# Patient Record
Sex: Male | Born: 1960 | Race: White | Hispanic: No | Marital: Married | Smoking: Never smoker
Health system: Southern US, Community
[De-identification: ages and names within clinical notes are randomized; demographics above are authoritative.]

## PROBLEM LIST (undated history)

## (undated) DIAGNOSIS — T7840XA Allergy, unspecified, initial encounter: Secondary | ICD-10-CM

## (undated) DIAGNOSIS — K429 Umbilical hernia without obstruction or gangrene: Secondary | ICD-10-CM

## (undated) DIAGNOSIS — Z87898 Personal history of other specified conditions: Secondary | ICD-10-CM

## (undated) DIAGNOSIS — D689 Coagulation defect, unspecified: Secondary | ICD-10-CM

## (undated) DIAGNOSIS — H538 Other visual disturbances: Secondary | ICD-10-CM

## (undated) DIAGNOSIS — J309 Allergic rhinitis, unspecified: Secondary | ICD-10-CM

## (undated) DIAGNOSIS — R079 Chest pain, unspecified: Secondary | ICD-10-CM

## (undated) HISTORY — DX: Umbilical hernia without obstruction or gangrene: K42.9

## (undated) HISTORY — DX: Personal history of other specified conditions: Z87.898

## (undated) HISTORY — DX: Coagulation defect, unspecified: D68.9

## (undated) HISTORY — DX: Allergic rhinitis, unspecified: J30.9

## (undated) HISTORY — DX: Chest pain, unspecified: R07.9

## (undated) HISTORY — PX: HERNIA REPAIR: SHX51

## (undated) HISTORY — DX: Allergy, unspecified, initial encounter: T78.40XA

---
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
## 2009-05-05 ENCOUNTER — Ambulatory Visit: Payer: Self-pay | Admitting: Internal Medicine

## 2009-05-05 DIAGNOSIS — J309 Allergic rhinitis, unspecified: Secondary | ICD-10-CM

## 2009-05-05 DIAGNOSIS — K429 Umbilical hernia without obstruction or gangrene: Secondary | ICD-10-CM | POA: Insufficient documentation

## 2009-05-05 DIAGNOSIS — G43109 Migraine with aura, not intractable, without status migrainosus: Secondary | ICD-10-CM

## 2009-05-05 DIAGNOSIS — Z87898 Personal history of other specified conditions: Secondary | ICD-10-CM

## 2009-05-05 HISTORY — DX: Umbilical hernia without obstruction or gangrene: K42.9

## 2009-05-05 HISTORY — DX: Personal history of other specified conditions: Z87.898

## 2009-05-05 HISTORY — DX: Allergic rhinitis, unspecified: J30.9

## 2010-10-12 ENCOUNTER — Ambulatory Visit: Payer: Self-pay | Admitting: Internal Medicine

## 2010-10-12 ENCOUNTER — Encounter: Payer: Self-pay | Admitting: Internal Medicine

## 2010-10-12 DIAGNOSIS — R079 Chest pain, unspecified: Secondary | ICD-10-CM

## 2010-10-12 HISTORY — DX: Chest pain, unspecified: R07.9

## 2011-01-04 NOTE — Assessment & Plan Note (Signed)
Summary: chest pain/dm   Vital Signs:  Patient profile:   50 year old male Weight:      181 pounds Temp:     98.3 degrees F oral BP sitting:   116 / 70  (left arm) Cuff size:   regular  Vitals Entered By: Duard Brady LPN (October 12, 2010 2:18 PM) CC: c/o chest heaviness x 3 days , sob no n/v or heartburn Is Patient Diabetic? No   CC:  c/o chest heaviness x 3 days  and sob no n/v or heartburn.  History of Present Illness: 50 year old patient without significant past medical history except for allergic rhinitis.  For the past 3 to 4 days and perhaps longer, he has had some vague chest heaviness.  It is not related to food intake or activity.  He does wax and wane a bit, but seems to be almost a constant discomfort.  There are no associated symptoms.  He states and he is physically active, such as walking up and down stairs quickly.  He gets a bit more short of breath, but no worsening chest pain.  He has no cardiac risk factors.  He denies any wheezing.  His father has a history of coronary artery disease, as well as valvular heart disease in his mother has cardiac dysrhythmia.  Both are still living.  He has a number of siblings without history of coronary artery disease.  Preventive Screening-Counseling & Management  Alcohol-Tobacco     Smoking Status: never  Allergies: 1)  ! Erythrocin Stearate (Erythromycin Stearate)  Past History:  Past Medical History: Reviewed history from 05/05/2009 and no changes required. ocular migraines Allergic rhinitis  Past Surgical History: Reviewed history from 05/05/2009 and no changes required. bilateral inguinal hernia at birth, 47  Family History: Reviewed history from 05/05/2009 and no changes required. father age 29, history of recent MI, and hypercholesterolemia mother, age 2, cardiac dysrhythmia, otherwise, in good health; leukemia  Five brothers 3 sisters-one sister with thyroid disorder paternal aunt with breast  cancer  Social History: Reviewed history from 05/05/2009 and no changes required. 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  The patient denies anorexia, fever, weight loss, weight gain, vision loss, decreased hearing, hoarseness, syncope, dyspnea on exertion, peripheral edema, prolonged cough, headaches, hemoptysis, abdominal pain, melena, hematochezia, severe indigestion/heartburn, hematuria, incontinence, genital sores, muscle weakness, suspicious skin lesions, transient blindness, difficulty walking, depression, unusual weight change, abnormal bleeding, enlarged lymph nodes, angioedema, breast masses, and testicular masses.    Physical Exam  General:  Well-developed,well-nourished,in no acute distress; alert,appropriate and cooperative throughout examination Head:  Normocephalic and atraumatic without obvious abnormalities. No apparent alopecia or balding. Eyes:  No corneal or conjunctival inflammation noted. EOMI. Perrla. Funduscopic exam benign, without hemorrhages, exudates or papilledema. Vision grossly normal. Mouth:  Oral mucosa and oropharynx without lesions or exudates.  Teeth in good repair. Neck:  No deformities, masses, or tenderness noted. Lungs:  Normal respiratory effort, chest expands symmetrically. Lungs are clear to auscultation, no crackles or wheezes. O2 saturation 98 Heart:  Normal rate and regular rhythm. S1 and S2 normal without gallop, murmur, click, rub or other extra sounds. pulse rate 60 Abdomen:  Bowel sounds positive,abdomen soft and non-tender without masses, organomegaly or hernias noted. Msk:  No deformity or scoliosis noted of thoracic or lumbar spine.   Pulses:  R and L carotid,radial,femoral,dorsalis pedis and posterior tibial pulses are full and equal bilaterally Extremities:  No clubbing,  cyanosis, edema, or deformity noted with normal full range of motion of all joints.     Impression & Recommendations:  Problem # 1:  CHEST PAIN (ICD-786.50)  Orders: EKG w/ Interpretation (93000)  Problem # 2:  ALLERGIC RHINITIS (ICD-477.9)  Patient Instructions: 1)  Avoid foods high in acid (tomatoes, citrus juices, spicy foods). Avoid eating within two hours of lying down or before exercising. Do not over eat; try smaller more frequent meals. Elevate head of bed twelve inches when sleeping. 2)  Symbicort 2 puffs twice daily  twice daily 3)  Dexilant one daily  4)  call if no improvement or any worsening of a  Orders Added: 1)  EKG w/ Interpretation [93000] 2)  Est. Patient Level III [16109]

## 2011-03-24 ENCOUNTER — Encounter: Payer: Self-pay | Admitting: Family Medicine

## 2011-03-24 ENCOUNTER — Ambulatory Visit (INDEPENDENT_AMBULATORY_CARE_PROVIDER_SITE_OTHER): Payer: Managed Care, Other (non HMO) | Admitting: Family Medicine

## 2011-03-24 VITALS — BP 140/94 | Temp 97.8°F | Ht 73.0 in | Wt 172.0 lb

## 2011-03-24 DIAGNOSIS — R209 Unspecified disturbances of skin sensation: Secondary | ICD-10-CM

## 2011-03-24 DIAGNOSIS — R202 Paresthesia of skin: Secondary | ICD-10-CM

## 2011-03-24 NOTE — Patient Instructions (Signed)
Start aspirin one daily. Follow up immediately for any recurrent numbness or weakness or any new neurologic symptoms.

## 2011-03-24 NOTE — Progress Notes (Signed)
  Subjective:    Patient ID: Dylan Terry, male    DOB: [birth year], 50 y.o.   MRN: [id number]  HPI Patient seen with question of TIA 2 days ago. He had onset of some right facial numbness followed by right arm numbness but no weakness. Symptoms lasted about one half hours then fully resolved. No slurred speech. No ataxia. No headaches. No other focal neurologic concerns. Note prior history of similar symptoms. No recurrent symptoms yesterday or today.  He takes no medications. Nonsmoker. No history of hypertension or diabetes. Recent lipids through work with cholesterol 177. He is not taking aspirin.  He has what sounds like past history of ocular migraines. Again no recent headaches.   Review of Systems  Constitutional: Negative for chills, activity change, appetite change and fatigue.  Respiratory: Negative for cough and shortness of breath.   Cardiovascular: Negative for chest pain, palpitations and leg swelling.  Gastrointestinal: Negative for abdominal pain.  Neurological: Positive for numbness. Negative for dizziness, tremors, seizures, syncope, speech difficulty, weakness, light-headedness and headaches.       Objective:   Physical Exam  Constitutional: He is oriented to person, place, and time. He appears well-developed and well-nourished. No distress.  HENT:  Head: Normocephalic and atraumatic.  Right Ear: External ear normal.  Left Ear: External ear normal.  Mouth/Throat: No oropharyngeal exudate.  Eyes: Pupils are equal, round, and reactive to light.  Neck: No thyromegaly present.  Cardiovascular: Normal rate, regular rhythm and normal heart sounds.  Exam reveals no gallop and no friction rub.   No murmur heard. Pulmonary/Chest: Effort normal and breath sounds normal. No respiratory distress. He has no wheezes. He has no rales.  Musculoskeletal: He exhibits no edema.  Lymphadenopathy:    He has no cervical adenopathy.  Neurological: He is alert and oriented to person, place,  and time. He has normal reflexes. No cranial nerve deficit.       Cerebellar normal by finger to nose testing. Babinski's downgoing bilaterally. No focal weakness. No sensory impairment  Skin: No rash noted.  Psychiatric: He has a normal mood and affect. His behavior is normal.          Assessment & Plan:  Transient paresthesias right face and right upper extremity over 2 days ago. No recurrent symptoms since then. We discussed this could represent TIA and recommended further evaluation. We'll schedule carotid Dopplers and echocardiogram. Discussed MRI.

## 2011-03-29 ENCOUNTER — Ambulatory Visit
Admission: RE | Admit: 2011-03-29 | Discharge: 2011-03-29 | Disposition: A | Discharge status: Home / Self Care | Payer: Managed Care, Other (non HMO) | Source: Ambulatory Visit | Attending: Family Medicine | Admitting: Family Medicine

## 2011-03-29 DIAGNOSIS — R202 Paresthesia of skin: Secondary | ICD-10-CM

## 2011-03-30 ENCOUNTER — Telehealth: Payer: Self-pay | Admitting: *Deleted

## 2011-03-30 NOTE — Telephone Encounter (Signed)
Spoke with pt regarding MRI results

## 2011-03-30 NOTE — Progress Notes (Signed)
Quick Note:  Pt informed ______ 

## 2011-03-30 NOTE — Telephone Encounter (Signed)
Would like to speak to Dr. Caryl Never about MRI results.

## 2011-04-05 ENCOUNTER — Other Ambulatory Visit: Payer: Self-pay | Admitting: Internal Medicine

## 2011-04-05 ENCOUNTER — Other Ambulatory Visit (HOSPITAL_COMMUNITY): Payer: Self-pay | Admitting: Radiology

## 2011-04-05 DIAGNOSIS — R29818 Other symptoms and signs involving the nervous system: Secondary | ICD-10-CM

## 2011-04-05 DIAGNOSIS — G459 Transient cerebral ischemic attack, unspecified: Secondary | ICD-10-CM

## 2011-04-06 ENCOUNTER — Other Ambulatory Visit: Payer: Self-pay | Admitting: *Deleted

## 2011-04-06 ENCOUNTER — Ambulatory Visit (HOSPITAL_COMMUNITY): Payer: Managed Care, Other (non HMO) | Attending: Internal Medicine | Admitting: Radiology

## 2011-04-06 ENCOUNTER — Encounter (INDEPENDENT_AMBULATORY_CARE_PROVIDER_SITE_OTHER): Payer: Managed Care, Other (non HMO) | Admitting: *Deleted

## 2011-04-06 DIAGNOSIS — R29818 Other symptoms and signs involving the nervous system: Secondary | ICD-10-CM

## 2011-04-06 DIAGNOSIS — R202 Paresthesia of skin: Secondary | ICD-10-CM

## 2011-04-06 DIAGNOSIS — R209 Unspecified disturbances of skin sensation: Secondary | ICD-10-CM | POA: Insufficient documentation

## 2011-04-06 DIAGNOSIS — G459 Transient cerebral ischemic attack, unspecified: Secondary | ICD-10-CM

## 2011-04-12 ENCOUNTER — Telehealth: Payer: Self-pay | Admitting: Internal Medicine

## 2011-04-12 NOTE — Telephone Encounter (Signed)
Please call/notify patient that lab/test/procedure is normal 

## 2011-04-12 NOTE — Telephone Encounter (Signed)
Pt req test results re: ekg and carotid doppler. Pls call.

## 2011-04-12 NOTE — Telephone Encounter (Signed)
Please advise 

## 2011-04-13 NOTE — Progress Notes (Signed)
Quick Note:  Pt informed on personally identified cell VM ______ 

## 2011-04-14 NOTE — Telephone Encounter (Signed)
Please call patient and notify lab is normal

## 2011-04-14 NOTE — Telephone Encounter (Signed)
Attempt to call cell# - VM - [id number] if questions - labs WNL

## 2012-10-03 ENCOUNTER — Encounter: Payer: Self-pay | Admitting: Internal Medicine

## 2012-10-03 ENCOUNTER — Ambulatory Visit (INDEPENDENT_AMBULATORY_CARE_PROVIDER_SITE_OTHER): Payer: Managed Care, Other (non HMO) | Admitting: Internal Medicine

## 2012-10-03 VITALS — BP 125/84 | Temp 97.6°F | Wt 175.0 lb

## 2012-10-03 DIAGNOSIS — K4090 Unilateral inguinal hernia, without obstruction or gangrene, not specified as recurrent: Secondary | ICD-10-CM

## 2012-10-03 NOTE — Patient Instructions (Signed)
General surgical followup as discussed  Return in one year for an annual exam

## 2012-10-03 NOTE — Progress Notes (Signed)
  Subjective:    Patient ID: Dylan Terry, male    DOB: 25-Aug-1961, 51 y.o.   MRN: [id number]  HPI  51 year old patient who has a history of a small umbilical hernia.  He had bilateral inguinal hernia repairs at a very early age. Or the past year he has noted a bulge in the left groin area. He has intermittent pain with activities.  Bulge is always very easily reducible  Past Medical History  Diagnosis Date  . ALLERGIC RHINITIS 05/05/2009  . HERNIA, UMBILICAL 05/05/2009  . CHEST PAIN 10/12/2010  . OPHTHALMIC MIGRAINES, HX OF 05/05/2009    History   Social History  . Marital Status: Married    Spouse Name: N/A    Number of Children: N/A  . Years of Education: N/A   Occupational History  . Not on file.   Social History Main Topics  . Smoking status: Never Smoker   . Smokeless tobacco: Not on file  . Alcohol Use: Not on file  . Drug Use: Not on file  . Sexually Active: Not on file   Other Topics Concern  . Not on file   Social History Narrative  . No narrative on file    Past Surgical History  Procedure Date  . Hernia repair 1962    inguinal    Family History  Problem Relation Age of Onset  . Heart disease Mother 36    cardiac dysrythmia  . Heart disease Father     MI  . Hyperlipidemia Father   . Thyroid disease Sister     3 sisters  . Thyroid disease Brother     5 brothers  . Cancer Paternal Aunt     breast    Allergies  Allergen Reactions  . Erythromycin Stearate     Current Outpatient Prescriptions on File Prior to Visit  Medication Sig Dispense Refill  . Multiple Vitamins-Minerals (MENS MULTI VITAMIN & MINERAL PO) Take by mouth.          BP 125/84  Temp 97.6 F (36.4 C) (Oral)  Wt 175 lb (79.379 kg)       Review of Systems  Constitutional: Negative for fever, chills, appetite change and fatigue.  HENT: Negative for hearing loss, ear pain, congestion, sore throat, trouble swallowing, neck stiffness, dental problem, voice change and tinnitus.     Eyes: Negative for pain, discharge and visual disturbance.  Respiratory: Negative for cough, chest tightness, wheezing and stridor.   Cardiovascular: Negative for chest pain, palpitations and leg swelling.  Gastrointestinal: Positive for abdominal pain. Negative for nausea, vomiting, diarrhea, constipation, blood in stool and abdominal distention.  Genitourinary: Negative for urgency, hematuria, flank pain, discharge, difficulty urinating and genital sores.  Musculoskeletal: Negative for myalgias, back pain, joint swelling, arthralgias and gait problem.  Skin: Negative for rash.  Neurological: Negative for dizziness, syncope, speech difficulty, weakness, numbness and headaches.  Hematological: Negative for adenopathy. Does not bruise/bleed easily.  Psychiatric/Behavioral: Negative for behavioral problems and dysphoric mood. The patient is not nervous/anxious.        Objective:   Physical Exam  Constitutional: He appears well-developed and well-nourished. No distress.  Abdominal: Soft. Bowel sounds are normal. He exhibits mass. He exhibits no distension. There is no tenderness. There is no rebound and no guarding.       Moderate size left inguinal hernia          Assessment & Plan:   Symptomatic left inguinal hernia. We'll set up for surgical referral

## 2012-10-12 ENCOUNTER — Ambulatory Visit (INDEPENDENT_AMBULATORY_CARE_PROVIDER_SITE_OTHER): Payer: Managed Care, Other (non HMO) | Admitting: Surgery

## 2012-10-16 ENCOUNTER — Encounter (INDEPENDENT_AMBULATORY_CARE_PROVIDER_SITE_OTHER): Payer: Self-pay | Admitting: Surgery

## 2012-10-16 ENCOUNTER — Ambulatory Visit (INDEPENDENT_AMBULATORY_CARE_PROVIDER_SITE_OTHER): Payer: Managed Care, Other (non HMO) | Admitting: Surgery

## 2012-10-16 VITALS — BP 122/80 | HR 70 | Temp 98.6°F | Resp 12 | Ht 73.0 in | Wt 173.6 lb

## 2012-10-16 DIAGNOSIS — K4020 Bilateral inguinal hernia, without obstruction or gangrene, not specified as recurrent: Secondary | ICD-10-CM

## 2012-10-16 DIAGNOSIS — K429 Umbilical hernia without obstruction or gangrene: Secondary | ICD-10-CM

## 2012-10-16 NOTE — Progress Notes (Signed)
Patient ID: Dylan Terry, male   DOB: 05/18/1961, 51 y.o.   MRN: [id number]  Chief Complaint  Patient presents with  . Hernia    HPI Dylan Terry is a 51 y.o. male.  Referred by Dr. Eleonore Chiquito HPI This is a healthy 51 year old male who presents with a history of a small umbilical hernia for several years. He has also had a visible left inguinal bulge for at least 2 years. This has become more uncomfortable. He remains reducible. He presents now for discussion of repair of both of these hernias. Appetite and bowel movements are normal.  Past Medical History  Diagnosis Date  . ALLERGIC RHINITIS 05/05/2009  . HERNIA, UMBILICAL 05/05/2009  . CHEST PAIN 10/12/2010  . OPHTHALMIC MIGRAINES, HX OF 05/05/2009    Past Surgical History  Procedure Date  . Hernia repair 1962    inguinal    Family History  Problem Relation Age of Onset  . Heart disease Mother 105    cardiac dysrythmia  . Heart disease Father     MI  . Hyperlipidemia Father   . Thyroid disease Sister     3 sisters  . Thyroid disease Brother     5 brothers  . Cancer Paternal Aunt     breast    Social History History  Substance Use Topics  . Smoking status: Never Smoker   . Smokeless tobacco: Not on file  . Alcohol Use: No    Allergies  Allergen Reactions  . Erythromycin Stearate     Current Outpatient Prescriptions  Medication Sig Dispense Refill  . Multiple Vitamins-Minerals (MENS MULTI VITAMIN & MINERAL PO) Take by mouth.          Review of Systems Review of Systems  Constitutional: Negative for fever, chills and unexpected weight change.  HENT: Negative for hearing loss, congestion, sore throat, trouble swallowing and voice change.   Eyes: Negative for visual disturbance.  Respiratory: Negative for cough and wheezing.   Cardiovascular: Negative for chest pain, palpitations and leg swelling.  Gastrointestinal: Negative for nausea, vomiting, abdominal pain, diarrhea, constipation, blood in stool, abdominal  distention, anal bleeding and rectal pain.  Genitourinary: Negative for hematuria and difficulty urinating.  Musculoskeletal: Negative for arthralgias.  Skin: Negative for rash and wound.  Neurological: Negative for seizures, syncope, weakness and headaches.  Hematological: Negative for adenopathy. Does not bruise/bleed easily.  Psychiatric/Behavioral: Negative for confusion.    Blood pressure 122/80, pulse 70, temperature 98.6 F (37 C), temperature source Temporal, resp. rate 12, height 6\' 1"  (1.854 m), weight 173 lb 10.1 oz (78.758 kg).  Physical Exam Physical Exam WDWN in NAD HEENT:  EOMI, sclera anicteric Neck:  No masses, no thyromegaly Lungs:  CTA bilaterally; normal respiratory effort CV:  Regular rate and rhythm; no murmurs Abd:  +bowel sounds, soft, small umbilical bulge with valsalva; reducible; defect 0.5 cm GU;  Bilateral descended testes; no testicular masses; palpable reducible bilateral inguinal hernias Ext:  Well-perfused; no edema Skin:  Warm, dry; no sign of jaundice  Data Reviewed none  Assessment    Bilateral inguinal hernias - reducible Umbilical hernia - reducible    Plan    Bilateral inguinal hernia repairs/ umbilical hernia repair with mesh.  The surgical procedure has been discussed with the patient.  Potential risks, benefits, alternative treatments, and expected outcomes have been explained.  All of the patient's questions at this time have been answered.  The likelihood of reaching the patient's treatment goal is good.  The patient understand the  proposed surgical procedure and wishes to proceed.        Kawika Bischoff K. 10/16/2012, 11:27 AM

## 2012-11-07 DIAGNOSIS — K4020 Bilateral inguinal hernia, without obstruction or gangrene, not specified as recurrent: Secondary | ICD-10-CM

## 2012-11-07 DIAGNOSIS — K429 Umbilical hernia without obstruction or gangrene: Secondary | ICD-10-CM

## 2012-11-22 ENCOUNTER — Ambulatory Visit (INDEPENDENT_AMBULATORY_CARE_PROVIDER_SITE_OTHER): Payer: Managed Care, Other (non HMO) | Admitting: Surgery

## 2012-11-22 ENCOUNTER — Encounter (INDEPENDENT_AMBULATORY_CARE_PROVIDER_SITE_OTHER): Payer: Self-pay | Admitting: Surgery

## 2012-11-22 VITALS — BP 122/80 | HR 68 | Temp 99.0°F | Resp 16 | Ht 73.0 in | Wt 166.0 lb

## 2012-11-22 DIAGNOSIS — K4020 Bilateral inguinal hernia, without obstruction or gangrene, not specified as recurrent: Secondary | ICD-10-CM

## 2012-11-22 DIAGNOSIS — K429 Umbilical hernia without obstruction or gangrene: Secondary | ICD-10-CM

## 2012-11-22 NOTE — Progress Notes (Signed)
The patient is status post hernia surgery on 11/07/12. He had a indirect recurrent right inguinal hernia, a large direct left inguinal hernia, and a 2 cm umbilical hernia. All of these were repaired with mesh. The patient still has some soreness but is improving. He had some swelling of the penis and scrotum but this seems to be improving. No sign of recurrent hernia at any of his incisions. He has the expected amount of swelling.    He should continue limiting his level of activity for the next 3 weeks. He may slowly begin returning to normal exercise regimen in early January. He may follow up with [place] as needed.  Wilmon Arms. Corliss Skains, MD, [facility]  11/22/2012 4:58 PM

## 2015-12-06 DIAGNOSIS — H538 Other visual disturbances: Secondary | ICD-10-CM

## 2015-12-06 HISTORY — DX: Other visual disturbances: H53.8

## 2016-03-08 ENCOUNTER — Telehealth: Payer: Self-pay | Admitting: Internal Medicine

## 2016-03-08 NOTE — Telephone Encounter (Signed)
Pt would like to know if you will accept him back as a pt?  Pt would like to have a cpe at that visit. Is that ok?

## 2016-03-08 NOTE — Telephone Encounter (Signed)
ok 

## 2016-03-08 NOTE — Telephone Encounter (Signed)
Pt has been scheduled.  °

## 2016-03-21 ENCOUNTER — Ambulatory Visit (INDEPENDENT_AMBULATORY_CARE_PROVIDER_SITE_OTHER): Payer: Managed Care, Other (non HMO) | Admitting: Internal Medicine

## 2016-03-21 ENCOUNTER — Encounter: Payer: Self-pay | Admitting: Internal Medicine

## 2016-03-21 VITALS — BP 102/60 | HR 50 | Temp 98.1°F | Resp 20 | Ht 73.0 in | Wt 163.0 lb

## 2016-03-21 DIAGNOSIS — G453 Amaurosis fugax: Secondary | ICD-10-CM

## 2016-03-21 DIAGNOSIS — G43809 Other migraine, not intractable, without status migrainosus: Secondary | ICD-10-CM

## 2016-03-21 DIAGNOSIS — G43109 Migraine with aura, not intractable, without status migrainosus: Secondary | ICD-10-CM

## 2016-03-21 MED ORDER — ATORVASTATIN CALCIUM 20 MG PO TABS: 20.0000 mg | ORAL_TABLET | Freq: Every day | ORAL | Status: DC

## 2016-03-21 MED ORDER — ASPIRIN 81 MG PO TABS: 81.0000 mg | ORAL_TABLET | Freq: Every day | ORAL | Status: DC

## 2016-03-21 NOTE — Progress Notes (Signed)
Subjective:    Patient ID: Dylan Terry, male    DOB: 10/16/1961, 55 y.o.   MRN: [id number]  HPI  55 year old patient who has a history of ophthalmic migraines.  These are usually, but not always associated with headaches and are associated with scintillations , but no significant visual loss.  The patient presented to the office today with a chief complaint of vertigo that occurred last week.  This lasted 5-10 seconds and has not reoccurred.  On further questioning, he experienced an episode of left monocular vision loss.  This was described as a gray darkness that affected his lower visual field and ascended.  The vision loss lasted for 30-60 seconds and was not associated with a headache.  He experienced a suspected left brain TIA in 2012 associated with right facial and right arm numbness.  A carotid artery Doppler study was completely normal and free of plaque.  Brain MRI also normal.  He has no cardiovascular risk factors  Past medical history otherwise unremarkable.  He states that he is recovering from flu last month Family history is significant with a sister with a history of TIA  Past Medical History  Diagnosis Date  . ALLERGIC RHINITIS 05/05/2009  . HERNIA, UMBILICAL [id number]  . CHEST PAIN 10/12/2010  . OPHTHALMIC MIGRAINES, HX OF 05/05/2009     Social History   Social History  . Marital Status: Married    Spouse Name: N/A  . Number of Children: N/A  . Years of Education: N/A   Occupational History  . Not on file.   Social History Main Topics  . Smoking status: Never Smoker   . Smokeless tobacco: Not on file  . Alcohol Use: No  . Drug Use: No  . Sexual Activity: Not on file   Other Topics Concern  . Not on file   Social History Narrative    Past Surgical History  Procedure Laterality Date  . Hernia repair  1962    inguinal    Family History  Problem Relation Age of Onset  . Heart disease Mother 41    cardiac dysrythmia  . Heart disease Father     MI   . Hyperlipidemia Father   . Thyroid disease Sister     3 sisters  . Thyroid disease Brother     5 brothers  . Cancer Paternal Aunt     breast    Allergies  Allergen Reactions  . Erythromycin Stearate     No current outpatient prescriptions on file prior to visit.   No current facility-administered medications on file prior to visit.    BP 102/60 mmHg  Pulse 50  Temp(Src) 98.1 F (36.7 C) (Oral)  Resp 20  Ht 6\' 1"  (1.854 m)  Wt 163 lb (73.936 kg)  BMI 21.51 kg/m2  SpO2 98%      Review of Systems  Constitutional: Negative for fever, chills, appetite change and fatigue.  HENT: Negative for congestion, dental problem, ear pain, hearing loss, sore throat, tinnitus, trouble swallowing and voice change.   Eyes: Positive for visual disturbance. Negative for pain and discharge.  Respiratory: Negative for cough, chest tightness, wheezing and stridor.   Cardiovascular: Negative for chest pain, palpitations and leg swelling.  Gastrointestinal: Negative for nausea, vomiting, abdominal pain, diarrhea, constipation, blood in stool and abdominal distention.  Genitourinary: Negative for urgency, hematuria, flank pain, discharge, difficulty urinating and genital sores.  Musculoskeletal: Negative for myalgias, back pain, joint swelling, arthralgias, gait problem and neck stiffness.  Skin:  Negative for rash.  Neurological: Positive for dizziness. Negative for syncope, speech difficulty, weakness, numbness and headaches.  Hematological: Negative for adenopathy. Does not bruise/bleed easily.  Psychiatric/Behavioral: Negative for behavioral problems and dysphoric mood. The patient is not nervous/anxious.        Objective:   Physical Exam  Constitutional: He is oriented to person, place, and time. He appears well-developed.  HENT:  Head: Normocephalic.  Right Ear: External ear normal.  Left Ear: External ear normal.  Eyes: Conjunctivae and EOM are normal. Pupils are equal, round,  and reactive to light.  Neck: Normal range of motion.  Cardiovascular: Normal rate and normal heart sounds.   Pulmonary/Chest: Breath sounds normal.  Abdominal: Bowel sounds are normal.  Musculoskeletal: Normal range of motion. He exhibits no edema or tenderness.  Neurological: He is alert and oriented to person, place, and time. He has normal reflexes. No cranial nerve deficit. Coordination normal.  Exam nonfocal  Psychiatric: He has a normal mood and affect. His behavior is normal.          Assessment & Plan:   Left-sided amaurosis fugax.  Unclear whether this represents a retinal migraine.  This appears to be the most likely diagnosis, although different presentation than his prior history of ophthalmic migraines. Patient seems very low risk for significant vascular disease.  He has had negative carotid artery Doppler studies and negative brain MRI in 2012.  Will place on low-dose aspirin.  Set up for neurology consultation  Defer  brain MRA and atorvastatin at this time  CPX as scheduled  Patient has been asked to have a followup  with ophthalmology as soon as possible

## 2016-03-21 NOTE — Progress Notes (Signed)
Pre visit review using our clinic review tool, if applicable. No additional management support is needed unless otherwise documented below in the visit note. 

## 2016-03-21 NOTE — Patient Instructions (Addendum)
  Neurology consultation Ophthalmology consultation  Report any new or recurrent symptoms

## 2016-04-25 ENCOUNTER — Encounter: Payer: Self-pay | Admitting: Neurology

## 2016-04-25 ENCOUNTER — Ambulatory Visit (INDEPENDENT_AMBULATORY_CARE_PROVIDER_SITE_OTHER): Payer: Managed Care, Other (non HMO) | Admitting: Neurology

## 2016-04-25 ENCOUNTER — Other Ambulatory Visit (INDEPENDENT_AMBULATORY_CARE_PROVIDER_SITE_OTHER): Payer: Managed Care, Other (non HMO)

## 2016-04-25 VITALS — BP 110/68 | HR 51 | Ht 73.0 in | Wt 160.0 lb

## 2016-04-25 DIAGNOSIS — G453 Amaurosis fugax: Secondary | ICD-10-CM

## 2016-04-25 DIAGNOSIS — G43809 Other migraine, not intractable, without status migrainosus: Secondary | ICD-10-CM

## 2016-04-25 DIAGNOSIS — G43109 Migraine with aura, not intractable, without status migrainosus: Secondary | ICD-10-CM

## 2016-04-25 LAB — SEDIMENTATION RATE: SED RATE: 1 mm/h (ref 0–20)

## 2016-04-25 NOTE — Progress Notes (Signed)
NEUROLOGY CONSULTATION NOTE  Dylan Terry MRN: [id number] DOB: [birth year]  Referring provider: Dr. Burnice Terry Primary care provider: Dr. Burnice Terry  Reason for consult:  Transient left vision loss  HISTORY OF PRESENT ILLNESS: Dylan Terry is a 55 year old left-handed man with ophthalmic migraines who presents for episode of amaurosis fugax.  History obtained by patient and PCP note.  Carotid doppler report and imaging of brain MRI from 2012 personally reviewed.  Mr. Dylan Terry has history of ocular migraines.  They usually present as scintillating scotoma that lasts 20 to 30 minutes, sometimes associated with headache.  They occur once every 2 months.  In March, he was experiencing them.  Then one day, while sitting in his chair, he developed graying out of vision in his left eye.  It lasted about 20 to 30 seconds and resolved.  There was no associated headache.  It was unlike his ocular migraines.  He denied focal numbness or weakness.  He didn't see his PCP until the following month.  He was started on ASA 81mg  daily.  He did have a suspected TIA in April 2012, presenting with right upper extremity and facial numbness.  MRI of brain from 03/30/11 showed no abnormal process.  Carotid doppler from 04/11/11 revealed no hemodynamically significant stenosis.  His younger sister had migraines.  His older sister had a TIA at age 51.  PAST MEDICAL HISTORY: Past Medical History  Diagnosis Date  . ALLERGIC RHINITIS 05/05/2009  . HERNIA, UMBILICAL [id number]  . CHEST PAIN 10/12/2010  . OPHTHALMIC MIGRAINES, HX OF 05/05/2009    PAST SURGICAL HISTORY: Past Surgical History  Procedure Laterality Date  . Hernia repair  1962    inguinal    MEDICATIONS: Current Outpatient Prescriptions on File Prior to Visit  Medication Sig Dispense Refill  . aspirin 81 MG tablet Take 1 tablet (81 mg total) by mouth daily. 30 tablet   . atorvastatin (LIPITOR) 20 MG tablet Take 1 tablet (20 mg total) by mouth daily.  (Patient not taking: Reported on 04/25/2016) 90 tablet 3  . ibuprofen (ADVIL,MOTRIN) 200 MG tablet Take 400 mg by mouth every 6 (six) hours as needed.     No current facility-administered medications on file prior to visit.    ALLERGIES: Allergies  Allergen Reactions  . Erythromycin Stearate     FAMILY HISTORY: Family History  Problem Relation Age of Onset  . Heart disease Mother 51    cardiac dysrythmia  . Heart disease Father     MI  . Hyperlipidemia Father   . Thyroid disease Sister     3 sisters  . Transient ischemic attack Sister   . Thyroid disease Brother     5 brothers  . Cancer Paternal Aunt     breast    SOCIAL HISTORY: Social History   Social History  . Marital Status: Married    Spouse Name: N/A  . Number of Children: N/A  . Years of Education: N/A   Occupational History  . Not on file.   Social History Main Topics  . Smoking status: Never Smoker   . Smokeless tobacco: Not on file  . Alcohol Use: No  . Drug Use: No  . Sexual Activity: Not on file   Other Topics Concern  . Not on file   Social History Narrative    REVIEW OF SYSTEMS: Constitutional: No fevers, chills, or sweats, no generalized fatigue, change in appetite Eyes: No visual changes, double vision, eye pain Ear, nose and throat: No hearing  loss, ear pain, nasal congestion, sore throat Cardiovascular: No chest pain, palpitations Respiratory:  No shortness of breath at rest or with exertion, wheezes GastrointestinaI: No nausea, vomiting, diarrhea, abdominal pain, fecal incontinence Genitourinary:  No dysuria, urinary retention or frequency Musculoskeletal:  No neck pain, back pain Integumentary: No rash, pruritus, skin lesions Neurological: as above Psychiatric: No depression, insomnia, anxiety Endocrine: No palpitations, fatigue, diaphoresis, mood swings, change in appetite, change in weight, increased thirst Hematologic/Lymphatic:  No purpura, petechiae. Allergic/Immunologic: no  itchy/runny eyes, nasal congestion, recent allergic reactions, rashes  PHYSICAL EXAM: Filed Vitals:   04/25/16 0851  BP: 110/68  Pulse: 51   General: No acute distress.  Patient appears well-groomed.  Head:  Normocephalic/atraumatic Eyes:  fundi examined but not visualized Neck: supple, no paraspinal tenderness, full range of motion Back: No paraspinal tenderness Heart: regular rate and rhythm Lungs: Clear to auscultation bilaterally. Vascular: No carotid bruits. Neurological Exam: Mental status: alert and oriented to person, place, and time, recent and remote memory intact, fund of knowledge intact, attention and concentration intact, speech fluent and not dysarthric, language intact. Cranial nerves: CN I: not tested CN II: pupils equal, round and reactive to light, visual fields intact CN III, IV, VI:  full range of motion, no nystagmus, no ptosis CN V: facial sensation intact CN VII: upper and lower face symmetric CN VIII: hearing intact CN IX, X: gag intact, uvula midline CN XI: sternocleidomastoid and trapezius muscles intact CN XII: tongue midline Bulk & Tone: normal, no fasciculations. Motor:  5/5 throughout Sensation: temperature and vibration sensation intact. Deep Tendon Reflexes:  2+ throughout, toes downgoing.  Finger to nose testing:  Without dysmetria.  Heel to shin:  Without dysmetria.  Gait:  Normal station and stride.  Able to turn and tandem walk. Romberg negative.  IMPRESSION: Amaurosis fugax.  Ocular migraine is possible.  However, since this was a negative symptom (vision loss) that was not associated with headache and not typical of his migraines, we cannot rule out an embolic event.  Also, we must take into consideration of his prior episode of right sided numbness and family history of TIA at a young age in his sister.   PLAN: 1.  ASA 81mg  daily 2.  Check MRI of brain, carotid doppler, 30 day holter, sed rate and hypercoagulable panel. 3.  Follow up  with ophthalmology 4.  Follow up with me in 3 months or after testing.  Thank you for allowing me to take part in the care of this patient.  Metta Clines, DO  CC:  Dylan Kaufmann, MD

## 2016-04-25 NOTE — Patient Instructions (Addendum)
It very well could be migraine, but we cannot rule out the possibility that this was a stroke-like event (or mini-stroke to the eye)  1.  Continue aspirin 81mg  daily 2.  We will check MRI of brain - To be done at [place]  [place] [phone] 3.  We will check carotid doppler 4.  We will check sed rate and hypercoagulable panel 5.  We will check a 30 day event monitor - TO be done at [facility] @ [address] [place],  [address] [phone] 6.  Follow up with ophthalmology 7.  Follow up with me in 3 months or after testing is completed.

## 2016-04-28 ENCOUNTER — Other Ambulatory Visit (HOSPITAL_COMMUNITY): Payer: Managed Care, Other (non HMO)

## 2016-04-29 ENCOUNTER — Other Ambulatory Visit (HOSPITAL_COMMUNITY): Payer: Managed Care, Other (non HMO)

## 2016-05-03 ENCOUNTER — Other Ambulatory Visit (HOSPITAL_COMMUNITY): Payer: Managed Care, Other (non HMO)

## 2016-05-04 ENCOUNTER — Other Ambulatory Visit (HOSPITAL_COMMUNITY): Payer: Managed Care, Other (non HMO)

## 2016-05-05 ENCOUNTER — Other Ambulatory Visit (HOSPITAL_COMMUNITY): Payer: Managed Care, Other (non HMO)

## 2016-05-06 ENCOUNTER — Other Ambulatory Visit (HOSPITAL_COMMUNITY): Payer: Managed Care, Other (non HMO)

## 2016-05-09 ENCOUNTER — Other Ambulatory Visit (HOSPITAL_COMMUNITY): Payer: Managed Care, Other (non HMO)

## 2016-05-09 LAB — HYPERCOAGULABLE PANEL, COMPREHENSIVE
ACT. PRT C RESIST W/FV DEFIC.: 2.8 ratio
APTT 1:1 NP: 29.6 s
APTT 1:1 Saline: 56.8 s
APTT: 38 s — ABNORMAL HIGH
AT III Act/Nor PPP Chro: 78 %
Anticardiolipin Ab, IgG: <10 [GPL'U]
Anticardiolipin Ab, IgM: <10 [MPL'U]
Beta-2 Glycoprotein I, IgA: <10 SAU
Beta-2 Glycoprotein I, IgG: <10 SGU
Beta-2 Glycoprotein I, IgM: <10 SMU
DRVVT Confirm Seconds: 41 s
DRVVT RATIO: 1.5 ratio — AB
DRVVT SCREEN SECONDS: 75.9 s — AB
Factor VII Antigen**: 59 % — ABNORMAL LOW
Factor VIII Activity: 57 %
HEXAGONAL PHOSPHOLIPID NEUTRAL: 8 s
Homocysteine: 9.3 umol/L
Prot C Ag Act/Nor PPP Imm: 50 % — ABNORMAL LOW
Prot S Ag Act/Nor PPP Imm: 63 %
Protein C Ag/FVII Ag Ratio**: 0.8 ratio
Protein S Ag/FVII Ag Ratio**: 1.1 ratio

## 2016-05-10 ENCOUNTER — Other Ambulatory Visit (HOSPITAL_COMMUNITY): Payer: Managed Care, Other (non HMO)

## 2016-05-11 ENCOUNTER — Telehealth: Payer: Self-pay

## 2016-05-11 ENCOUNTER — Telehealth: Payer: Self-pay | Admitting: Neurology

## 2016-05-11 ENCOUNTER — Other Ambulatory Visit (HOSPITAL_COMMUNITY): Payer: Managed Care, Other (non HMO)

## 2016-05-11 DIAGNOSIS — R899 Unspecified abnormal finding in specimens from other organs, systems and tissues: Secondary | ICD-10-CM

## 2016-05-11 NOTE — Telephone Encounter (Signed)
Yes, I would still proceed with MRI of brain.  If possible, I would schedule hematology consult for after MRI is performed (so she has the full picture).

## 2016-05-11 NOTE — Telephone Encounter (Signed)
-----   Message from Pieter Partridge, DO sent at 05/11/2016  7:58 AM EDT ----- The labs that test for increased blood clotting shows an abnormality.  However, it may be of no clinical significance as there are specific ways to interpret it.  I would like him to see Dr. Heath Lark, from Hematology to review the labs and determine the clinical significance of this result.

## 2016-05-11 NOTE — Telephone Encounter (Signed)
Pt aware. Referral placed to [facility] [phone]. [address] [place], [place] [address] ph: [phone] fax: [phone]). While on the phone patient asked if he still needed to go ahead with MRI. Told patient I would only call him back if provider felt he did NOT need to keep appointment. Please advise.

## 2016-05-11 NOTE — Telephone Encounter (Signed)
Patient made aware to proceed with MR (but have done prior to hematology appt).

## 2016-05-11 NOTE — Telephone Encounter (Signed)
Dylan Terry 11/29/1961. He was returning Jada's call from earlier today. She had left him a message regarding Dr. Georgie Chard message. Can you please call him at [phone]. Thank you

## 2016-05-12 ENCOUNTER — Other Ambulatory Visit (HOSPITAL_COMMUNITY): Payer: Managed Care, Other (non HMO)

## 2016-05-13 ENCOUNTER — Other Ambulatory Visit (HOSPITAL_COMMUNITY): Payer: Managed Care, Other (non HMO)

## 2016-05-16 ENCOUNTER — Other Ambulatory Visit (HOSPITAL_COMMUNITY): Payer: Managed Care, Other (non HMO)

## 2016-05-16 ENCOUNTER — Telehealth: Payer: Self-pay

## 2016-05-16 ENCOUNTER — Ambulatory Visit
Admission: RE | Admit: 2016-05-16 | Discharge: 2016-05-16 | Disposition: A | Discharge status: Home / Self Care | Payer: Managed Care, Other (non HMO) | Source: Ambulatory Visit | Attending: Neurology | Admitting: Neurology

## 2016-05-16 DIAGNOSIS — G453 Amaurosis fugax: Secondary | ICD-10-CM

## 2016-05-16 NOTE — Telephone Encounter (Signed)
Message relayed to patient. Verbalized understanding and denied questions.  Pt did ask for contact info for Hematoloy. Given.  [facility] [phone]. [address], [place], [place] [address] ph: [phone] fax: [phone])

## 2016-05-16 NOTE — Telephone Encounter (Signed)
-----   Message from Pieter Partridge, DO sent at 05/16/2016  9:08 AM EDT ----- MRI of brain was unremarkable.  I still suspect that he had a small blood clot to the eye which then quickly dissolved and would continue current management

## 2016-05-17 ENCOUNTER — Encounter: Payer: Self-pay | Admitting: Hematology and Oncology

## 2016-05-17 ENCOUNTER — Other Ambulatory Visit (HOSPITAL_COMMUNITY): Payer: Managed Care, Other (non HMO)

## 2016-05-18 ENCOUNTER — Other Ambulatory Visit (HOSPITAL_COMMUNITY): Payer: Managed Care, Other (non HMO)

## 2016-05-19 ENCOUNTER — Other Ambulatory Visit (HOSPITAL_COMMUNITY): Payer: Managed Care, Other (non HMO)

## 2016-05-20 ENCOUNTER — Other Ambulatory Visit (HOSPITAL_COMMUNITY): Payer: Managed Care, Other (non HMO)

## 2016-05-23 ENCOUNTER — Other Ambulatory Visit (HOSPITAL_COMMUNITY): Payer: Managed Care, Other (non HMO)

## 2016-05-24 ENCOUNTER — Ambulatory Visit (INDEPENDENT_AMBULATORY_CARE_PROVIDER_SITE_OTHER): Payer: Managed Care, Other (non HMO) | Admitting: Internal Medicine

## 2016-05-24 ENCOUNTER — Encounter: Payer: Self-pay | Admitting: Internal Medicine

## 2016-05-24 ENCOUNTER — Other Ambulatory Visit (HOSPITAL_COMMUNITY): Payer: Managed Care, Other (non HMO)

## 2016-05-24 VITALS — BP 110/70 | HR 51 | Temp 98.2°F | Resp 18 | Ht 72.25 in | Wt 164.0 lb

## 2016-05-24 DIAGNOSIS — G43809 Other migraine, not intractable, without status migrainosus: Secondary | ICD-10-CM

## 2016-05-24 DIAGNOSIS — Z23 Encounter for immunization: Secondary | ICD-10-CM | POA: Diagnosis not present

## 2016-05-24 DIAGNOSIS — Z0000 Encounter for general adult medical examination without abnormal findings: Secondary | ICD-10-CM | POA: Diagnosis not present

## 2016-05-24 DIAGNOSIS — G43109 Migraine with aura, not intractable, without status migrainosus: Secondary | ICD-10-CM

## 2016-05-24 NOTE — Progress Notes (Signed)
Pre visit review using our clinic review tool, if applicable. No additional management support is needed unless otherwise documented below in the visit note. 

## 2016-05-24 NOTE — Patient Instructions (Signed)
Neurology and hematology follow-up  Schedule your colonoscopy to help detect colon cancer.  Return in one year for follow-up

## 2016-05-24 NOTE — Progress Notes (Signed)
Subjective:    Patient ID: Dylan Terry, male    DOB: [birth year], 55 y.o.   MRN: [id number]  HPI  55 year old patient who is seen today for a preventive health exam  Past history is pertinent for history of ophthalmic migraines. In 2012, he was evaluated for a suspected TIA. More recently he suffered a episode of left-sided amaurosis fugax and has seen neurology.  A hypercoagulable panel was equivocal and  hematology evaluation is pending.  He has had no recurrent focal neurological symptoms  He generally feels well  Surgical history is pertinent in that he has had bilateral inguinal hernia repair as well as a small umbilical hernia  He has been on aspirin and low intensity statin therapy following his recent episode of amaurosis fugax  Family history father age 13, history of MI at 70, status post CABG.  Father had a PE following bypass surgery Mother died of complications of leukemia at 25  4 brothers 3 sisters.  One sister had an apparent TIA with , speech difficulty Another sister with chronic migraines  He has a annual laboratory screen performed At work Total cholesterol 154 LDL cholesterol 97 HDL cholesterol 30 9.3  Glucose 80 Triglycerides 88    Past Medical History  Diagnosis Date  . ALLERGIC RHINITIS 05/05/2009  . HERNIA, UMBILICAL 05/05/2009  . CHEST PAIN 10/12/2010  . OPHTHALMIC MIGRAINES, HX OF 05/05/2009     Social History   Social History  . Marital Status: Married    Spouse Name: N/A  . Number of Children: N/A  . Years of Education: N/A   Occupational History  . Not on file.   Social History Main Topics  . Smoking status: Never Smoker   . Smokeless tobacco: Not on file  . Alcohol Use: No  . Drug Use: No  . Sexual Activity: Not on file   Other Topics Concern  . Not on file   Social History Narrative    Past Surgical History  Procedure Laterality Date  . Hernia repair  1962    inguinal    Family History  Problem Relation Age of Onset  .  Heart disease Mother 85    cardiac dysrythmia  . Heart disease Father     MI  . Hyperlipidemia Father   . Thyroid disease Sister     3 sisters  . Transient ischemic attack Sister   . Thyroid disease Brother     5 brothers  . Cancer Paternal Aunt     breast    Allergies  Allergen Reactions  . Erythromycin Stearate     Current Outpatient Prescriptions on File Prior to Visit  Medication Sig Dispense Refill  . aspirin 81 MG tablet Take 1 tablet (81 mg total) by mouth daily. 30 tablet   . ibuprofen (ADVIL,MOTRIN) 200 MG tablet Take 400 mg by mouth every 6 (six) hours as needed.     No current facility-administered medications on file prior to visit.    BP 110/70 mmHg  Pulse 51  Temp(Src) 98.2 F (36.8 C) (Oral)  Resp 18  Ht 6' 0.25" (1.835 m)  Wt 164 lb (74.39 kg)  BMI 22.09 kg/m2  SpO2 98%     Review of Systems  Constitutional: Negative for fever, chills, activity change, appetite change and fatigue.  HENT: Negative for congestion, dental problem, ear pain, hearing loss, mouth sores, rhinorrhea, sinus pressure, sneezing, tinnitus, trouble swallowing and voice change.   Eyes: Negative for photophobia, pain, redness and visual disturbance.  Respiratory: Negative for apnea, cough, choking, chest tightness, shortness of breath and wheezing.   Cardiovascular: Negative for chest pain, palpitations and leg swelling.  Gastrointestinal: Negative for nausea, vomiting, abdominal pain, diarrhea, constipation, blood in stool, abdominal distention, anal bleeding and rectal pain.  Genitourinary: Negative for dysuria, urgency, frequency, hematuria, flank pain, decreased urine volume, discharge, penile swelling, scrotal swelling, difficulty urinating, genital sores and testicular pain.  Musculoskeletal: Negative for myalgias, back pain, joint swelling, arthralgias, gait problem, neck pain and neck stiffness.  Skin: Negative for color change, rash and wound.  Neurological: Negative for  dizziness, tremors, seizures, syncope, facial asymmetry, speech difficulty, weakness, light-headedness, numbness and headaches.  Hematological: Negative for adenopathy. Does not bruise/bleed easily.  Psychiatric/Behavioral: Negative for suicidal ideas, hallucinations, behavioral problems, confusion, sleep disturbance, self-injury, dysphoric mood, decreased concentration and agitation. The patient is not nervous/anxious.        Objective:   Physical Exam  Constitutional: He appears well-developed and well-nourished.  HENT:  Head: Normocephalic and atraumatic.  Right Ear: External ear normal.  Left Ear: External ear normal.  Nose: Nose normal.  Mouth/Throat: Oropharynx is clear and moist.  Eyes: Conjunctivae and EOM are normal. Pupils are equal, round, and reactive to light. No scleral icterus.  Neck: Normal range of motion. Neck supple. No JVD present. No thyromegaly present.  Cardiovascular: Regular rhythm, normal heart sounds and intact distal pulses.  Exam reveals no gallop and no friction rub.   No murmur heard. Pulmonary/Chest: Effort normal and breath sounds normal. He exhibits no tenderness.  Abdominal: Soft. Bowel sounds are normal. He exhibits no distension and no mass. There is no tenderness.  Genitourinary: Prostate normal and penis normal. Guaiac negative stool.  Musculoskeletal: Normal range of motion. He exhibits no edema or tenderness.  Lymphadenopathy:    He has no cervical adenopathy.  Neurological: He is alert. He has normal reflexes. No cranial nerve deficit. Coordination normal.  Skin: Skin is warm and dry. No rash noted.  Psychiatric: He has a normal mood and affect. His behavior is normal.          Assessment & Plan:   Preventive health examination History of amaurosis fugax and history of left brain TIA 2012 History of ophthalmic migraine Rule out hypercoagulability disorder  Patient will be set her for a screening colonoscopy later this year after.   Hematologic and neurology evaluation.  Complete No change in medical regimen

## 2016-05-25 ENCOUNTER — Other Ambulatory Visit (HOSPITAL_COMMUNITY): Payer: Managed Care, Other (non HMO)

## 2016-05-26 ENCOUNTER — Other Ambulatory Visit (HOSPITAL_COMMUNITY): Payer: Managed Care, Other (non HMO)

## 2016-05-27 ENCOUNTER — Other Ambulatory Visit (HOSPITAL_COMMUNITY): Payer: Managed Care, Other (non HMO)

## 2016-05-30 ENCOUNTER — Telehealth: Payer: Self-pay | Admitting: Hematology and Oncology

## 2016-05-30 ENCOUNTER — Other Ambulatory Visit (HOSPITAL_COMMUNITY): Payer: Managed Care, Other (non HMO)

## 2016-05-30 ENCOUNTER — Ambulatory Visit (HOSPITAL_BASED_OUTPATIENT_CLINIC_OR_DEPARTMENT_OTHER): Payer: Managed Care, Other (non HMO) | Admitting: Hematology and Oncology

## 2016-05-30 ENCOUNTER — Encounter: Payer: Self-pay | Admitting: Hematology and Oncology

## 2016-05-30 VITALS — BP 121/73 | HR 50 | Temp 98.3°F | Resp 18 | Ht 72.25 in | Wt 162.5 lb

## 2016-05-30 DIAGNOSIS — D6859 Other primary thrombophilia: Secondary | ICD-10-CM | POA: Insufficient documentation

## 2016-05-30 DIAGNOSIS — G453 Amaurosis fugax: Secondary | ICD-10-CM | POA: Diagnosis not present

## 2016-05-30 DIAGNOSIS — D6862 Lupus anticoagulant syndrome: Secondary | ICD-10-CM

## 2016-05-30 DIAGNOSIS — R001 Bradycardia, unspecified: Secondary | ICD-10-CM | POA: Diagnosis not present

## 2016-05-30 NOTE — Assessment & Plan Note (Signed)
The patient has history of possible TIA in 2012 with extensive workup. He had recent symptom of amaurosis fugax affecting the left eye, with no permanent deficit. He is doing well on aspirin therapy. It is not clear whether this is related to recent findings of abnormal thrombophilia panel. I recommend he continue aspirin therapy for now until next visit.

## 2016-05-30 NOTE — Assessment & Plan Note (Signed)
He has borderline reduce protein C level. This is not a definitive diagnosis for protein C deficiency. Vitamin K deficiency sometime can also cause borderline protein C deficiency. I recommend vitamin K rich diet with green, leafy vegetables and to repeat the tests again in August and he agreed with the plan of care.

## 2016-05-30 NOTE — Progress Notes (Signed)
[facility] NOTE  Patient Care Team: Marletta Lor, MD as PCP - Cane Savannah, DO as Consulting Physician (Neurology)  CHIEF COMPLAINTS/PURPOSE OF CONSULTATION:  Recent TIA, abnormal thrombophilia workup  HISTORY OF PRESENTING ILLNESS:  Dylan Terry 55 y.o. male is here because of recent complaints of amaurosis fugax affecting his left eye. It lasted for approximately 15 seconds and was described as a curtain going down his left eye. He had complete recovery of his eye function and was placed on aspirin therapy since then. He was referred to see neurologist. On 04/25/2016, he had extensive blood work which came back borderline positive results for lupus anticoagulant and low protein C level. The patient has similar complaints in 2012 with numbness and tingling sensation affecting his right face in the right arm. He had extensive testing including echocardiogram, MRI and vascular ultrasound. He felt that his symptoms never completely go away and he has altered sensation on his left face. He was recommended to take aspirin but he only took it for a few weeks and discontinue since then. He has positive family history of TIA. The patient does not smoke. He does not have diabetes. He does not have cardiovascular disease that he is aware of. He denies prior history of thrombosis/DVT/PE  MEDICAL HISTORY:  Past Medical History  Diagnosis Date  . ALLERGIC RHINITIS 05/05/2009  . HERNIA, UMBILICAL [id number]  . CHEST PAIN 10/12/2010  . OPHTHALMIC MIGRAINES, HX OF 05/05/2009    SURGICAL HISTORY: Past Surgical History  Procedure Laterality Date  . Hernia repair  1962    inguinal    SOCIAL HISTORY: Social History   Social History  . Marital Status: Married    Spouse Name: N/A  . Number of Children: N/A  . Years of Education: N/A   Occupational History  . Not on file.   Social History Main Topics  . Smoking status: Never Smoker   . Smokeless tobacco:  Never Used  . Alcohol Use: No  . Drug Use: No  . 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 Lives with wife 1 son and 1 daughter   Other Topics Concern  . Not on file   Social History Narrative    FAMILY HISTORY: Family History  Problem Relation Age of Onset  . Heart disease Mother 39    cardiac dysrythmia  . Cancer Mother     leukemia  . Heart disease Father     MI  . Hyperlipidemia Father   . Thyroid disease Sister     3 sisters  . Transient ischemic attack Sister   . Thyroid disease Brother     5 brothers  . Cancer Paternal Aunt     breast  . Heart disease Paternal Grandfather     heart attack    ALLERGIES:  is allergic to erythromycin stearate.  MEDICATIONS:  Current Outpatient Prescriptions  Medication Sig Dispense Refill  . aspirin 81 MG tablet Take 1 tablet (81 mg total) by mouth daily. 30 tablet   . ibuprofen (ADVIL,MOTRIN) 200 MG tablet Take 400 mg by mouth every 6 (six) hours as needed.     No current facility-administered medications for this visit.    REVIEW OF SYSTEMS:   Constitutional: Denies fevers, chills or abnormal night sweats Eyes: Denies blurriness of vision, double vision or watery eyes Ears, nose, mouth, throat, and face: Denies mucositis or sore throat Respiratory: Denies cough, dyspnea or wheezes Cardiovascular: Denies  palpitation, chest discomfort or lower extremity swelling Gastrointestinal:  Denies nausea, heartburn or change in bowel habits Skin: Denies abnormal skin rashes Lymphatics: Denies new lymphadenopathy or easy bruising Behavioral/Psych: Mood is stable, no new changes  All other systems were reviewed with the patient and are negative.  PHYSICAL EXAMINATION: ECOG PERFORMANCE STATUS: 0 - Asymptomatic  Filed Vitals:   05/30/16 1026  BP: 121/73  Pulse: 50  Temp: 98.3 F (36.8 C)  Resp: 18   Filed Weights   05/30/16 1026  Weight: 162 lb 8 oz (73.71 kg)    GENERAL:alert, no distress and  comfortable SKIN: skin color, texture, turgor are normal, no rashes or significant lesions EYES: normal, conjunctiva are pink and non-injected, sclera clear OROPHARYNX:no exudate, no erythema and lips, buccal mucosa, and tongue normal  NECK: supple, thyroid normal size, non-tender, without nodularity LYMPH:  no palpable lymphadenopathy in the cervical, axillary or inguinal LUNGS: clear to auscultation and percussion with normal breathing effort HEART: regular rate & rhythm and no murmurs and no lower extremity edema ABDOMEN:abdomen soft, non-tender and normal bowel sounds Musculoskeletal:no cyanosis of digits and no clubbing  PSYCH: alert & oriented x 3 with fluent speech NEURO: no focal motor/sensory deficits  LABORATORY DATA:  I have reviewed the data as listed  RADIOGRAPHIC STUDIES: I have personally reviewed the radiological images as listed and agreed with the findings in the report. Mr Brain Wo Contrast  05/16/2016  CLINICAL DATA:  TIA. Amaurosis fugax. Temporary left eye vision loss. Buzzing behind the ear and numbness in the arm 3 months ago. Single episode. EXAM: MRI HEAD WITHOUT CONTRAST TECHNIQUE: Multiplanar, multiecho pulse sequences of the brain and surrounding structures were obtained without intravenous contrast. 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 There is no evidence of acute infarct, intracranial hemorrhage, mass, midline shift, or extra-axial fluid collection. Ventricles and sulci are normal in size. Clustered, small foci of T2 hyperintensity in the left centrum semiovale are unchanged and nonspecific but compatible with minimal chronic small vessel ischemic disease. Orbits are unremarkable on this nondedicated study. There is minimal right and mild left maxillary sinus mucosal thickening. The mastoid air cells are clear. Major intracranial vascular flow voids are preserved. IMPRESSION: 1. No acute intracranial abnormality. 2.  Minimal chronic small vessel ischemic disease. Electronically Signed   By: Logan Bores M.D.   On: 05/16/2016 08:33    ASSESSMENT & PLAN:  Amaurosis fugax, left eye The patient has history of possible TIA in 2012 with extensive workup. He had recent symptom of amaurosis fugax affecting the left eye, with no permanent deficit. He is doing well on aspirin therapy. It is not clear whether this is related to recent findings of abnormal thrombophilia panel. I recommend he continue aspirin therapy for now until next visit.  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 Overall, he does not fulfill the criteria for antiphospholipid antibody syndrome. I recommend repeating the panel again 12 weeks from his recent blood work, to be done around August 14th. I will bring him back around August 14 for blood work and see him back 10 days later to review test results. He agreed with the plan of care.  Protein C deficiency ([place]) He has borderline reduce protein C level. This is not a definitive diagnosis for protein C deficiency. Vitamin K deficiency sometime can also cause borderline protein C deficiency. I recommend  vitamin K rich diet with green, leafy vegetables and to repeat the tests again in August and he agreed with the plan of care.  Bradycardia The patient has persistent bradycardia. His last EKG reading from 2011 also pointed out possible arrhythmia. He denies sensation of palpitation. If repeat thrombophilia workup in the future is unrevealing for cause of his recent TIA, the patient may benefit from cardiology workup     . Orders Placed This Encounter  Procedures  . Protein C activity*    Standing Status: Future     Number of Occurrences:      Standing Expiration Date: 05/30/2017  . Protein C, total*    Standing Status: Future     Number  of Occurrences:      Standing Expiration Date: 05/30/2017  . Lupus anticoagulant panel*    Standing Status: Future     Number of Occurrences:      Standing Expiration Date: 05/30/2017     All questions were answered. The patient knows to call the clinic with any problems, questions or concerns. I spent 40 minutes counseling the patient face to face. The total time spent in the appointment was 55 minutes and more than 50% was on counseling.     [facility], [place], MD 05/30/2016 3:02 PM

## 2016-05-30 NOTE — Assessment & Plan Note (Signed)
He had recent abnormal panel with borderline elevated APTT without concurrent elevation of anti-Cardiolipin antibodies of beta 2 glycoprotein antibodies. Overall, he does not fulfill the criteria for antiphospholipid antibody syndrome. I recommend repeating the panel again 12 weeks from his recent blood work, to be done around August 14th. I will bring him back around August 14 for blood work and see him back 10 days later to review test results. He agreed with the plan of care.

## 2016-05-30 NOTE — Assessment & Plan Note (Signed)
The patient has persistent bradycardia. His last EKG reading from 2011 also pointed out possible arrhythmia. He denies sensation of palpitation. If repeat thrombophilia workup in the future is unrevealing for cause of his recent TIA, the patient may benefit from cardiology workup

## 2016-05-30 NOTE — Telephone Encounter (Signed)
Gave and printed appt sched and avs for pt for Aug °

## 2016-05-31 ENCOUNTER — Other Ambulatory Visit (HOSPITAL_COMMUNITY): Payer: Managed Care, Other (non HMO)

## 2016-06-01 ENCOUNTER — Other Ambulatory Visit (HOSPITAL_COMMUNITY): Payer: Managed Care, Other (non HMO)

## 2016-06-02 ENCOUNTER — Other Ambulatory Visit (HOSPITAL_COMMUNITY): Payer: Managed Care, Other (non HMO)

## 2016-06-03 ENCOUNTER — Other Ambulatory Visit (HOSPITAL_COMMUNITY): Payer: Managed Care, Other (non HMO)

## 2016-06-06 ENCOUNTER — Other Ambulatory Visit (HOSPITAL_COMMUNITY): Payer: Managed Care, Other (non HMO)

## 2016-06-08 ENCOUNTER — Other Ambulatory Visit (HOSPITAL_COMMUNITY): Payer: Managed Care, Other (non HMO)

## 2016-06-09 ENCOUNTER — Other Ambulatory Visit (HOSPITAL_COMMUNITY): Payer: Managed Care, Other (non HMO)

## 2016-06-10 ENCOUNTER — Encounter: Payer: Self-pay | Admitting: Internal Medicine

## 2016-06-10 ENCOUNTER — Other Ambulatory Visit (HOSPITAL_COMMUNITY): Payer: Managed Care, Other (non HMO)

## 2016-06-13 ENCOUNTER — Other Ambulatory Visit (HOSPITAL_COMMUNITY): Payer: Managed Care, Other (non HMO)

## 2016-06-14 ENCOUNTER — Other Ambulatory Visit (HOSPITAL_COMMUNITY): Payer: Managed Care, Other (non HMO)

## 2016-06-15 ENCOUNTER — Other Ambulatory Visit (HOSPITAL_COMMUNITY): Payer: Managed Care, Other (non HMO)

## 2016-06-16 ENCOUNTER — Other Ambulatory Visit (HOSPITAL_COMMUNITY): Payer: Managed Care, Other (non HMO)

## 2016-06-17 ENCOUNTER — Other Ambulatory Visit (HOSPITAL_COMMUNITY): Payer: Managed Care, Other (non HMO)

## 2016-06-20 ENCOUNTER — Other Ambulatory Visit (HOSPITAL_COMMUNITY): Payer: Managed Care, Other (non HMO)

## 2016-06-21 ENCOUNTER — Other Ambulatory Visit (HOSPITAL_COMMUNITY): Payer: Managed Care, Other (non HMO)

## 2016-06-22 ENCOUNTER — Other Ambulatory Visit (HOSPITAL_COMMUNITY): Payer: Managed Care, Other (non HMO)

## 2016-07-18 ENCOUNTER — Other Ambulatory Visit (HOSPITAL_BASED_OUTPATIENT_CLINIC_OR_DEPARTMENT_OTHER): Payer: Managed Care, Other (non HMO)

## 2016-07-18 DIAGNOSIS — D6859 Other primary thrombophilia: Secondary | ICD-10-CM | POA: Diagnosis not present

## 2016-07-18 DIAGNOSIS — D6862 Lupus anticoagulant syndrome: Secondary | ICD-10-CM

## 2016-07-19 LAB — PROTEIN C ACTIVITY: PROTEIN C ACTIVITY: 68 % — AB (ref 73–180)

## 2016-07-20 LAB — PROTEIN C, TOTAL: Protein C Antigen: 62 % (ref 60–150)

## 2016-07-20 LAB — LUPUS ANTICOAGULANT PANEL
DRVVT: 57.7 s — AB (ref 0.0–47.0)
PTT-LA: 39.6 s (ref 0.0–51.9)
dRVVT Confirm: 1.6 ratio — ABNORMAL HIGH (ref 0.8–1.2)
dRVVT Mix: 53.5 s — ABNORMAL HIGH (ref 0.0–47.0)

## 2016-07-28 ENCOUNTER — Ambulatory Visit: Payer: Managed Care, Other (non HMO) | Admitting: Neurology

## 2016-07-28 ENCOUNTER — Ambulatory Visit (HOSPITAL_BASED_OUTPATIENT_CLINIC_OR_DEPARTMENT_OTHER): Payer: Managed Care, Other (non HMO) | Admitting: Hematology and Oncology

## 2016-07-28 ENCOUNTER — Encounter: Payer: Self-pay | Admitting: Hematology and Oncology

## 2016-07-28 DIAGNOSIS — G453 Amaurosis fugax: Secondary | ICD-10-CM

## 2016-07-28 DIAGNOSIS — D6859 Other primary thrombophilia: Secondary | ICD-10-CM

## 2016-07-28 DIAGNOSIS — D6862 Lupus anticoagulant syndrome: Secondary | ICD-10-CM | POA: Diagnosis not present

## 2016-07-28 NOTE — Assessment & Plan Note (Signed)
The patient has confirmed mild protein C deficiency. Protein C deficiency is typically associated with increased risk of blood clots. I do not think his recent TIA is related to this, though. One of the main issue we discussed today included the role of screening other family members for thrombophilia disorder. At present time, I would not recommend testing the patient's family members as it would not benefit them.  Thrombophilia disorder is a genetic predisposition which increases an individual's risk for a thrombotic event, NOT a disease.  We discussed the implications of genetic screening including the possibility of uninsurability, costs involved, emotional distress and possible discrimination at various levels for the affected individual.  Rather than genetic screening, one can possibly benefit from genetic counseling or dissemination of appropriate reading materials to educate other family members.  Finally, at the end of our consultation today, I reinforced the importance of preventive strategies such as avoiding hormonal supplement, avoiding cigarette smoking, keeping up-to-date with screening programs for early cancer detection, frequent ambulation for long distance travel and aggressive DVT prophylaxis in all surgical settings.

## 2016-07-28 NOTE — Assessment & Plan Note (Signed)
The patient has confirmed lupus anticoagulant disorder. I explained to the patient the pathophysiology of lupus anticoagulant disorder. I suspect his recent TIAs related to this. The patient has no overt symptoms to suggest active autoimmune disorder.

## 2016-07-28 NOTE — Assessment & Plan Note (Signed)
He has no further reccurrence of TIAs since he started on aspirin. In view of concurrent diagnosis of lupus anticoagulant disorder and protein C deficiency, he would qualify for chronic anticoagulation therapy with warfarin. However, the patient would like to avoid this if all possible because of significant life style changes. He is willing to increase the dose of aspirin to 325 mg daily along with aggressive risk factor management including tight control of his cholesterol level with aim to get his LDL goal less than 70.

## 2016-07-28 NOTE — Progress Notes (Signed)
[facility] NOTE  Nyoka Cowden, MD SUMMARY OF HEMATOLOGIC HISTORY: Dylan Terry 54 y.o. male is here because of recent complaints of amaurosis fugax affecting his left eye. It lasted for approximately 15 seconds and was described as a curtain going down his left eye. He had complete recovery of his eye function and was placed on aspirin therapy since then. He was referred to see neurologist. On 04/25/2016, he had extensive blood work which came back borderline positive results for lupus anticoagulant and low protein C level. The patient has similar complaints in 2012 with numbness and tingling sensation affecting his right face in the right arm. He had extensive testing including echocardiogram, MRI and vascular ultrasound. He felt that his symptoms never completely go away and he has altered sensation on his left face. He was recommended to take aspirin but he only took it for a few weeks and discontinue since then. He has positive family history of TIA. The patient does not smoke. He does not have diabetes. He does not have cardiovascular disease that he is aware of. He denies prior history of thrombosis/DVT/PE The patient was placed on aspirin therapy. Repeat blood work in August 2017 confirmed mild protein C deficiency and persistent lupus anticoagulant  INTERVAL HISTORY: Dylan Terry 55 y.o. male returns for follow-up. He feels well. He is compliant with aspirin therapy. He has no further recurrent TIA/neurological deficits. The patient denies any recent signs or symptoms of bleeding such as spontaneous epistaxis, hematuria or hematochezia.   I have reviewed the past medical history, past surgical history, social history and family history with the patient and they are unchanged from previous note.  ALLERGIES:  is allergic to erythromycin stearate.  MEDICATIONS:  Current Outpatient Prescriptions  Medication Sig Dispense Refill  . aspirin  81 MG tablet Take 1 tablet (81 mg total) by mouth daily. 30 tablet   . ibuprofen (ADVIL,MOTRIN) 200 MG tablet Take 400 mg by mouth every 6 (six) hours as needed.     No current facility-administered medications for this visit.      REVIEW OF SYSTEMS:   Constitutional: Denies fevers, chills or night sweats Eyes: Denies blurriness of vision Ears, nose, mouth, throat, and face: Denies mucositis or sore throat Respiratory: Denies cough, dyspnea or wheezes Cardiovascular: Denies palpitation, chest discomfort or lower extremity swelling Gastrointestinal:  Denies nausea, heartburn or change in bowel habits Skin: Denies abnormal skin rashes Lymphatics: Denies new lymphadenopathy or easy bruising Neurological:Denies numbness, tingling or new weaknesses Behavioral/Psych: Mood is stable, no new changes  All other systems were reviewed with the patient and are negative.  PHYSICAL EXAMINATION: ECOG PERFORMANCE STATUS: 0 - Asymptomatic  Vitals:   07/28/16 0857  BP: 123/73  Pulse: (!) 51  Resp: 18  Temp: 98 F (36.7 C)   Filed Weights   07/28/16 0857  Weight: 165 lb 1.6 oz (74.9 kg)    GENERAL:alert, no distress and comfortable SKIN: skin color, texture, turgor are normal, no rashes or significant lesions EYES: normal, Conjunctiva are pink and non-injected, sclera clear ABDOMEN:abdomen soft, non-tender and normal bowel sounds Musculoskeletal:no cyanosis of digits and no clubbing  NEURO: alert & oriented x 3 with fluent speech, no focal motor/sensory deficits  LABORATORY DATA:  I have reviewed the data as listed related to protein C deficiency and presence of lupus anticagulant  ASSESSMENT & PLAN:  Protein C deficiency ([place]) The patient has confirmed mild protein C deficiency. Protein C deficiency is typically associated with increased risk  of blood clots. I do not think his recent TIA is related to this, though. One of the main issue we discussed today included the role of screening  other family members for thrombophilia disorder. At present time, I would not recommend testing the patient's family members as it would not benefit them.  Thrombophilia disorder is a genetic predisposition which increases an individual's risk for a thrombotic event, NOT a disease.  We discussed the implications of genetic screening including the possibility of uninsurability, costs involved, emotional distress and possible discrimination at various levels for the affected individual.  Rather than genetic screening, one can possibly benefit from genetic counseling or dissemination of appropriate reading materials to educate other family members.  Finally, at the end of our consultation today, I reinforced the importance of preventive strategies such as avoiding hormonal supplement, avoiding cigarette smoking, keeping up-to-date with screening programs for early cancer detection, frequent ambulation for long distance travel and aggressive DVT prophylaxis in all surgical settings.   Lupus anticoagulant disorder [facility]) The patient has confirmed lupus anticoagulant disorder. I explained to the patient the pathophysiology of lupus anticoagulant disorder. I suspect his recent TIAs related to this. The patient has no overt symptoms to suggest active autoimmune disorder.  Amaurosis fugax, left eye He has no further reccurrence of TIAs since he started on aspirin. In view of concurrent diagnosis of lupus anticoagulant disorder and protein C deficiency, he would qualify for chronic anticoagulation therapy with warfarin. However, the patient would like to avoid this if all possible because of significant life style changes. He is willing to increase the dose of aspirin to 325 mg daily along with aggressive risk factor management including tight control of his cholesterol level with aim to get his LDL goal less than 70.     All questions were answered. The patient knows to call the clinic with any problems,  questions or concerns. No barriers to learning was detected.  I spent 20 minutes counseling the patient face to face. The total time spent in the appointment was 25 minutes and more than 50% was on counseling.     Sadie Hazelett, MD 8/24/201710:53 AM

## 2017-06-19 IMAGING — MR MR HEAD W/O CM
11 series · 48 of 48 positions shown · non-contrast
Comparison: 03/29/2011

CLINICAL DATA: TIA. Amaurosis fugax. Temporary left eye vision
loss. Buzzing behind the ear and numbness in the arm 3 months ago.
Single episode.

EXAM:
MRI HEAD WITHOUT CONTRAST
TECHNIQUE: Multiplanar, multiecho pulse sequences of the brain and surrounding
structures were obtained without intravenous contrast.

[Series 2: t1_se_sag · sagittal · 5.0mm · 0.49mm/px · 2 of 21 slices shown]
[im 1/21]
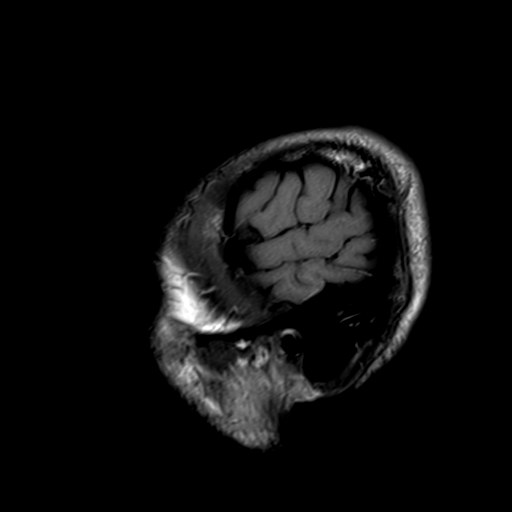
[im 21/21]
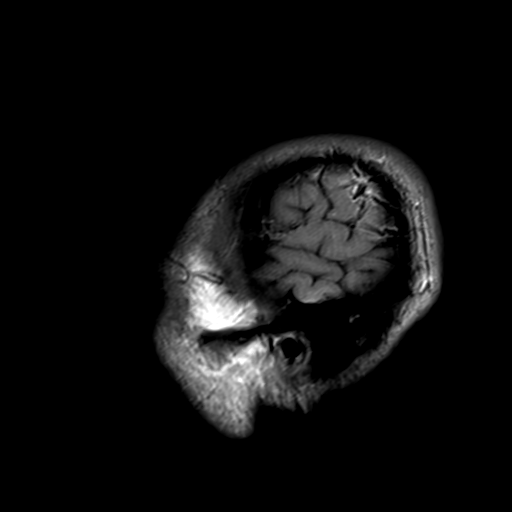

[Series 3: ep2d_diff_(id)_trace · axial · 3.0mm · 1.95mm/px · z∈[-67,+79]mm · 10 of 100 slices shown]
[im 1/100]
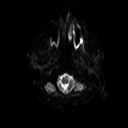
[im 12/100]
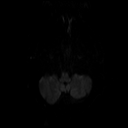
[im 23/100]
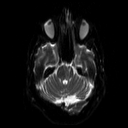
[im 34/100]
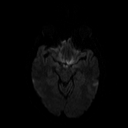
[im 45/100]
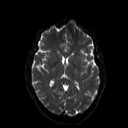
[im 56/100]
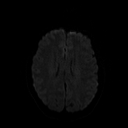
[im 67/100]
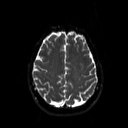
[im 78/100]
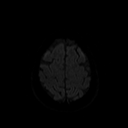
[im 89/100]
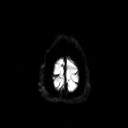
[im 100/100]
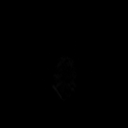

[Series 4: ep2d_diff_(id)_trace_adc · axial · 3.0mm · 1.95mm/px · z∈[-67,+79]mm · 5 of 50 slices shown]
[im 1/50]
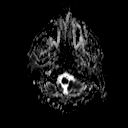
[im 13/50]
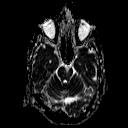
[im 25/50]
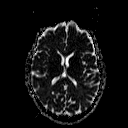
[im 37/50]
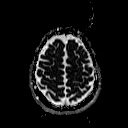
[im 50/50]
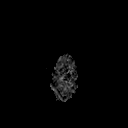

[Series 6: swi_images · axial · 2.0mm · 0.98mm/px · z∈[-72,+85]mm · 8 of 80 slices shown]
[im 1/80]
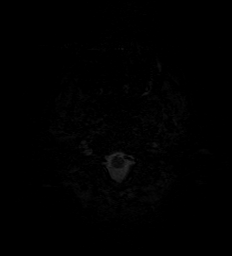
[im 12/80]
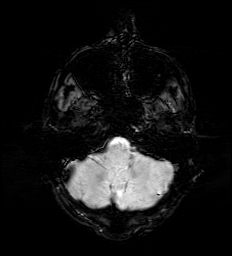
[im 23/80]
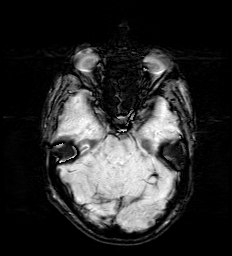
[im 34/80]
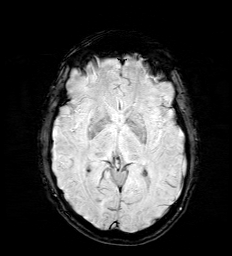
[im 46/80]
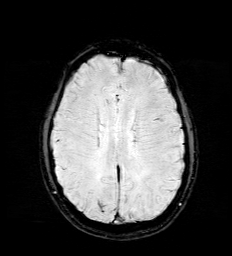
[im 57/80]
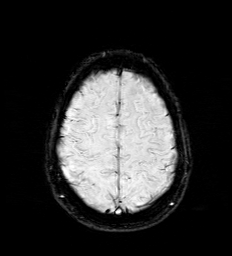
[im 68/80]
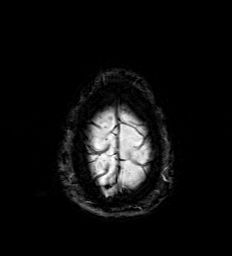
[im 80/80]
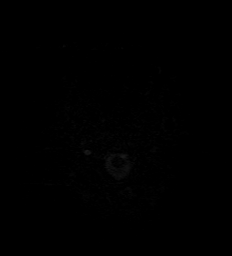

[Series 7: ep2d_diff_cor · coronal · 5.0mm · 1.77mm/px · 5 of 47 slices shown]
[im 1/47]
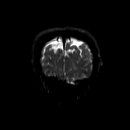
[im 12/47]
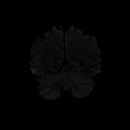
[im 24/47]
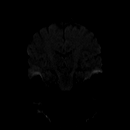
[im 35/47]
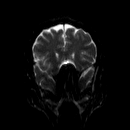
[im 47/47]
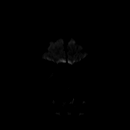

[Series 8: ep2d_diff_cor_adc · coronal · 5.0mm · 1.77mm/px · 2 of 24 slices shown]
[im 1/24]
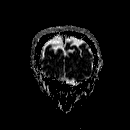
[im 24/24]
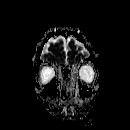

[Series 9: FLAIR · axial · 5.0mm · 0.49mm/px · z∈[-63,+74]mm · 2 of 23 slices shown (1 of 2)]
[im 1/23]
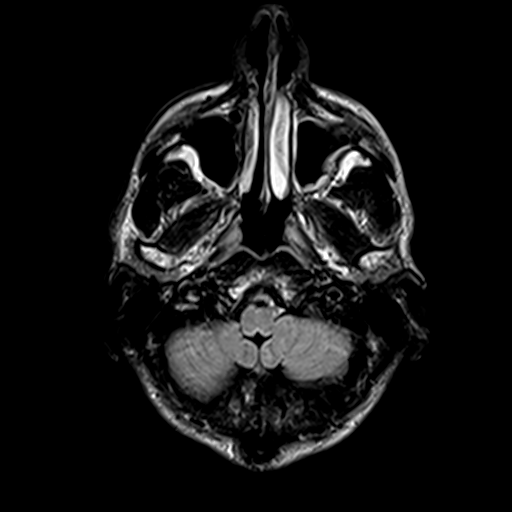
[im 23/23]
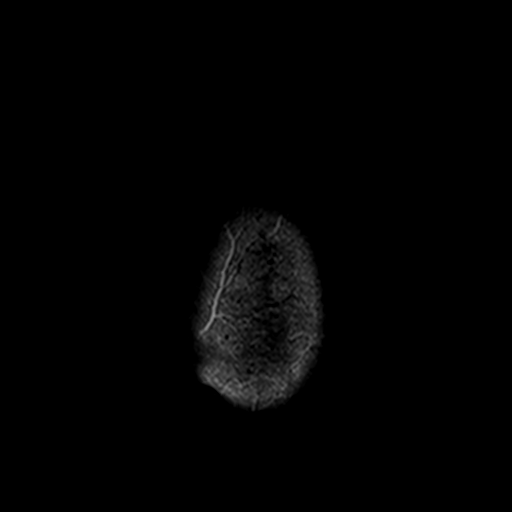

[Series 10: t2_tse_tra_512 · axial · 5.0mm · 0.65mm/px · z∈[-62,+76]mm · 2 of 23 slices shown]
[im 1/23]
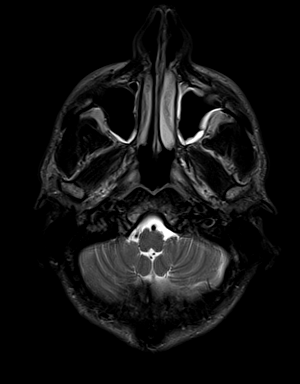
[im 23/23]
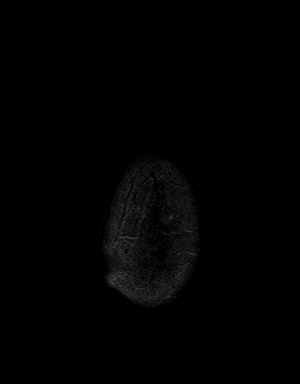

[Series 11: t1_mpr_tra · axial · 2.0mm · 0.49mm/px · z∈[-72,+86]mm · 8 of 80 slices shown]
[im 1/80]
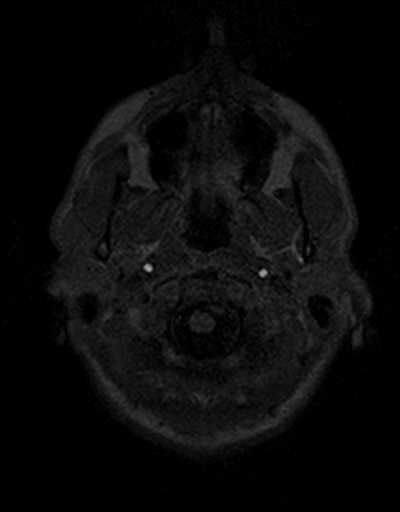
[im 12/80]
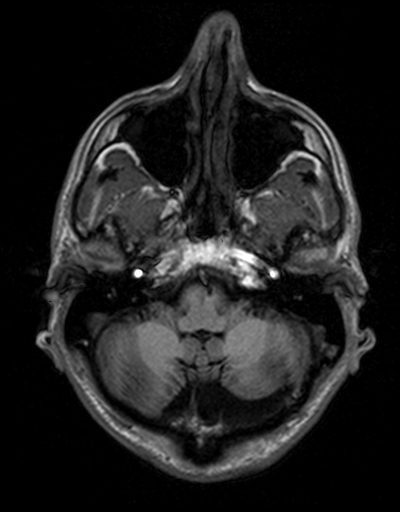
[im 23/80]
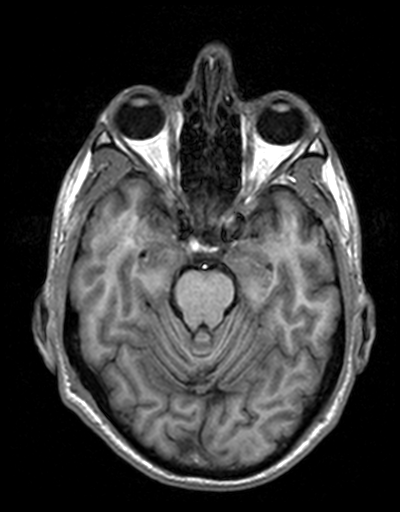
[im 34/80]
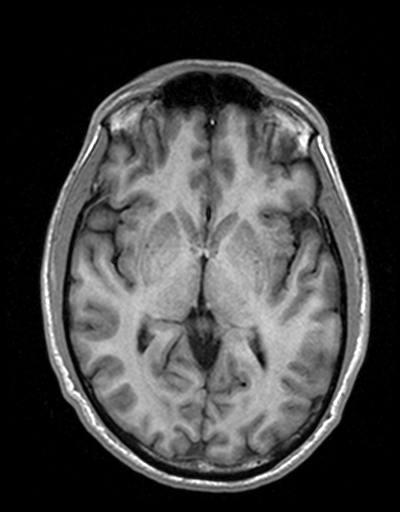
[im 46/80]
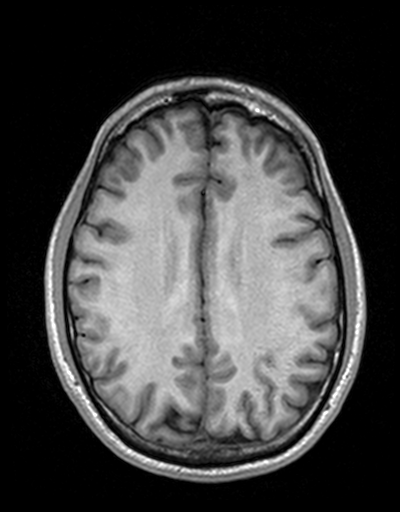
[im 57/80]
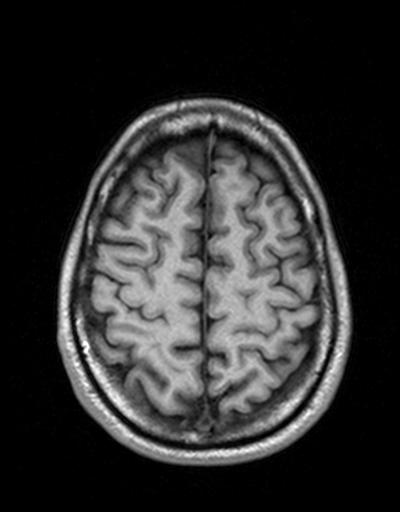
[im 68/80]
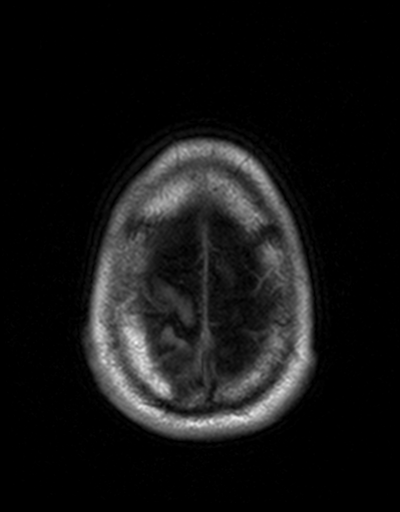
[im 80/80]
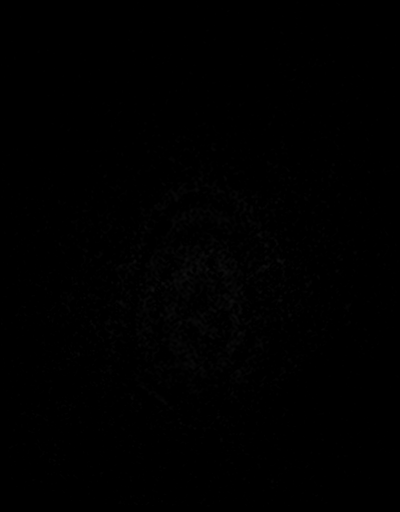

[Series 12: T2 · coronal · 5.0mm · 0.45mm/px · 2 of 25 slices shown]
[im 1/25]
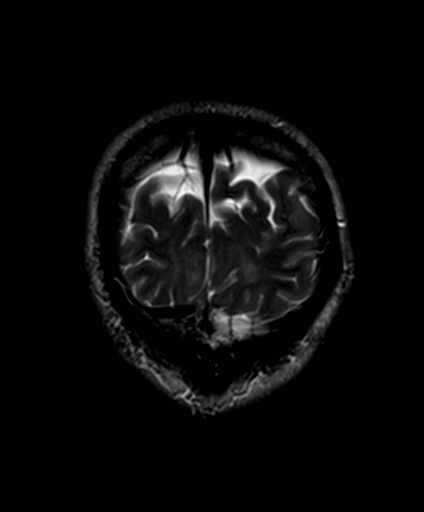
[im 25/25]
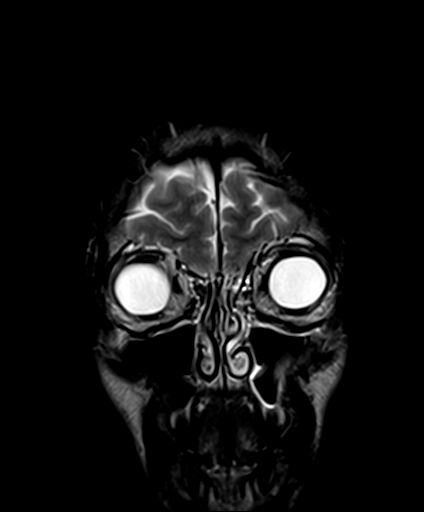

[Series 13: FLAIR · sagittal · 5.0mm · 0.49mm/px · 2 of 23 slices shown (2 of 2)]
[im 1/23]
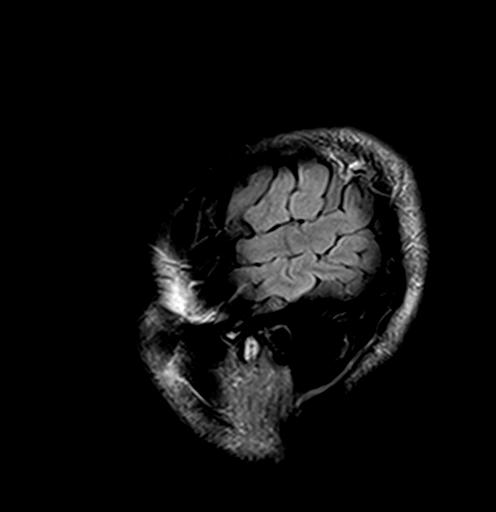
[im 23/23]
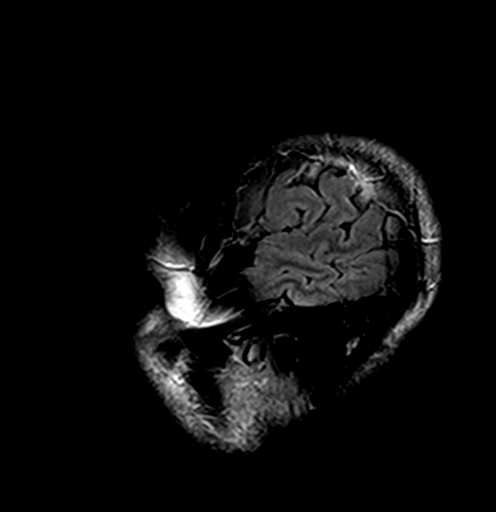

[48 of 48 positions shown; findings below may reference images not displayed]

FINDINGS: An incidental mega cisterna magna is again noted. There is no
evidence of acute infarct, intracranial hemorrhage, mass, midline
shift, or extra-axial fluid collection. Ventricles and sulci are
normal in size. Clustered, small foci of T2 hyperintensity in the
left centrum semiovale are unchanged and nonspecific but compatible
with minimal chronic small vessel ischemic disease.

Orbits are unremarkable on this nondedicated study. There is minimal
right and mild left maxillary sinus mucosal thickening. The mastoid
air cells are clear. Major intracranial vascular flow voids are
preserved.
IMPRESSION: 1. No acute intracranial abnormality.
2. Minimal chronic small vessel ischemic disease.

## 2017-09-25 ENCOUNTER — Encounter: Payer: Self-pay | Admitting: Internal Medicine

## 2017-09-25 ENCOUNTER — Ambulatory Visit (INDEPENDENT_AMBULATORY_CARE_PROVIDER_SITE_OTHER): Payer: Managed Care, Other (non HMO) | Admitting: Internal Medicine

## 2017-09-25 VITALS — BP 132/68 | HR 54 | Temp 98.1°F | Ht 72.3 in | Wt 176.6 lb

## 2017-09-25 DIAGNOSIS — S0083XA Contusion of other part of head, initial encounter: Secondary | ICD-10-CM | POA: Diagnosis not present

## 2017-09-25 NOTE — Patient Instructions (Signed)
Apply ice to the sore area for 15 to 20 minutes 3 or 4 times daily for the next two to 3 days.  Call or return to clinic prn if these symptoms worsen or fail to improve as anticipated.

## 2017-09-25 NOTE — Progress Notes (Signed)
Subjective:    Patient ID: Dylan Terry, male    DOB: [birth year], 56 y.o.   MRN: [id number]  HPI  56 year old patient, who was involved with cutting some trees on his property yesterday sustaining trauma from a tree branch to the nasal area.  He has a history of a prior surgery for a deviated septum.  There was initial leading from both nares that resolved through the night.  He has minimal pain and no difficulty breathing through either side of the nose.  Past Medical History:  Diagnosis Date  . ALLERGIC RHINITIS 05/05/2009  . CHEST PAIN 10/12/2010  . HERNIA, UMBILICAL 01/12/3150  . OPHTHALMIC MIGRAINES, HX OF 05/05/2009     Social History   Social History  . Marital status: Married    Spouse name: N/A  . Number of children: N/A  . Years of education: N/A   Occupational History  . Not on file.   Social History Main Topics  . Smoking status: Never Smoker  . Smokeless tobacco: Never Used  . Alcohol use No  . Drug use: No  . 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 Lives with wife 1 son and 1 daughter   Other Topics Concern  . Not on file   Social History Narrative  . No narrative on file    Past Surgical History:  Procedure Laterality Date  . HERNIA REPAIR  1962   inguinal    Family History  Problem Relation Age of Onset  . Heart disease Mother 70       cardiac dysrythmia  . Cancer Mother        leukemia  . Heart disease Father        MI  . Hyperlipidemia Father   . Thyroid disease Sister        3 sisters  . Transient ischemic attack Sister   . Thyroid disease Brother        5 brothers  . Cancer Paternal Aunt        breast  . Heart disease Paternal Grandfather        heart attack    Allergies  Allergen Reactions  . Erythromycin Stearate Nausea And Vomiting    Current Outpatient Prescriptions on File Prior to Visit  Medication Sig Dispense Refill  . aspirin 81 MG tablet Take 1 tablet (81 mg total) by mouth daily. 30 tablet     . ibuprofen (ADVIL,MOTRIN) 200 MG tablet Take 400 mg by mouth every 6 (six) hours as needed.     No current facility-administered medications on file prior to visit.     BP 132/68 (BP Location: Left Arm, Patient Position: Sitting, Cuff Size: Normal)   Pulse (!) 54   Temp 98.1 F (36.7 C) (Oral)   Ht 6' 0.3" (1.836 m)   Wt 176 lb 9.6 oz (80.1 kg)   SpO2 98%   BMI 23.75 kg/m     Review of Systems  Constitutional: Negative for appetite change, chills, fatigue and fever.  HENT: Negative for congestion, dental problem, ear pain, hearing loss, sore throat, tinnitus, trouble swallowing and voice change.   Eyes: Negative for pain, discharge and visual disturbance.  Respiratory: Negative for cough, chest tightness, wheezing and stridor.   Cardiovascular: Negative for chest pain, palpitations and leg swelling.  Gastrointestinal: Negative for abdominal distention, abdominal pain, blood in stool, constipation, diarrhea, nausea and vomiting.  Genitourinary: Negative for difficulty urinating, discharge, flank pain, genital sores, hematuria and  urgency.  Musculoskeletal: Negative for arthralgias, back pain, gait problem, joint swelling, myalgias and neck stiffness.  Skin: Negative for rash.  Neurological: Negative for dizziness, syncope, speech difficulty, weakness, numbness and headaches.  Hematological: Negative for adenopathy. Does not bruise/bleed easily.  Psychiatric/Behavioral: Negative for behavioral problems and dysphoric mood. The patient is not nervous/anxious.        Objective:   Physical Exam  Constitutional: He appears well-developed and well-nourished. No distress.  Blood pressure controlled Pulse 60  HENT:  Soft tissue swelling and ecchymoses involving the nose Nose is symmetrical No septal deviation Only minimal tenderness involving the bridge of the nose          Assessment & Plan:   Facial nasal trauma/contusion.  Doubt nasal fracture.  Normal  alignment.  We'll clinically observe at this time.  Continue local therapy/ice for the next 24 hours Patient will report any new or worsening symptoms  Nyoka Cowden

## 2017-10-17 ENCOUNTER — Telehealth: Payer: Self-pay

## 2017-10-17 DIAGNOSIS — S0992XA Unspecified injury of nose, initial encounter: Secondary | ICD-10-CM

## 2017-10-17 NOTE — Telephone Encounter (Signed)
Patient called to ask for ENT referral. He states that his nose "still does not feel right". He would like to know who you would recommend.  Dr.K - Please advise. Thanks!

## 2017-10-17 NOTE — Telephone Encounter (Signed)
ENT referral with Dr. Lucia Gaskins

## 2017-10-17 NOTE — Telephone Encounter (Signed)
Referral placed. Pt aware. 

## 2018-08-21 ENCOUNTER — Ambulatory Visit: Payer: Self-pay | Admitting: Internal Medicine

## 2018-08-21 NOTE — Telephone Encounter (Signed)
Pt. called to report onset of sore throat on Saturday.  Stated he has a stuffy/ runny nose.  Nasal drainage is "clear".   Reported headache, and low grade fever of 99.5-99.7, with intermittent chills and sweats.  Stated sore throat has subsided.  Reported cough started today with a "tickle" in throat.  Denied any shortness of breath or chest congestion.  Advised pt. he could try home care measures, and call back if symptoms worsen.  Questioned how long this may last?  Advised it is hard to predict, but it could last for about a week or more.  Home care recommendations given per protocol.  Encouraged to call back if symptoms worsen, or don't improve.  Verb. Understanding.         Reason for Disposition . Care advice for fever, questions about  Answer Assessment - Initial Assessment Questions 1. ONSET: "When did the nasal discharge start?"      C/o sore throat and runny nose on Saturday 2. AMOUNT: "How much discharge is there?"      Stuffy and runny nose  3. COUGH: "Do you have a cough?" If yes, ask: "Describe the color of your sputum" (clear, white, yellow, green)     Cough started on Sunday; started with a "tickle" 4. RESPIRATORY DISTRESS: "Describe your breathing."      Denied SOB 5. FEVER: "Do you have a fever?" If so, ask: "What is your temperature, how was it measured, and when did it start?"     99 .7 6. SEVERITY: "Overall, how bad are you feeling right now?" (e.g., doesn't interfere with normal activities, staying home from school/work, staying in bed)      Feeling like "it is steady" 7. OTHER SYMPTOMS: "Do you have any other symptoms?" (e.g., sore throat, earache, wheezing, vomiting)     Sore throat, runny nose, low grade fever, cough  Protocols used: COMMON COLD-A-AH  Message from Sheran Luz sent at 08/21/2018 3:59 PM EDT   Summary: runny nose,sore throat, 99.5 temp x4days   Pt is requesting a call back from a nurse. Pt states that he has been sick for 4 days with a temp of  99.5, runny nose and sore throat. Pt would like to know if there is a "bug" going around and what he can expect going forward if so. Pt is unsure if he should make appt until he speaks with a nurse.

## 2019-07-09 ENCOUNTER — Other Ambulatory Visit: Payer: Self-pay

## 2019-07-09 ENCOUNTER — Encounter: Payer: Self-pay | Admitting: Internal Medicine

## 2019-07-09 ENCOUNTER — Encounter: Payer: Managed Care, Other (non HMO) | Admitting: Internal Medicine

## 2019-07-10 ENCOUNTER — Ambulatory Visit (INDEPENDENT_AMBULATORY_CARE_PROVIDER_SITE_OTHER): Payer: 59 | Admitting: Internal Medicine

## 2019-07-10 ENCOUNTER — Other Ambulatory Visit: Payer: Self-pay

## 2019-07-10 ENCOUNTER — Encounter: Payer: Self-pay | Admitting: Gastroenterology

## 2019-07-10 DIAGNOSIS — G43109 Migraine with aura, not intractable, without status migrainosus: Secondary | ICD-10-CM | POA: Diagnosis not present

## 2019-07-10 DIAGNOSIS — Z1211 Encounter for screening for malignant neoplasm of colon: Secondary | ICD-10-CM

## 2019-07-10 DIAGNOSIS — G453 Amaurosis fugax: Secondary | ICD-10-CM

## 2019-07-10 DIAGNOSIS — D6859 Other primary thrombophilia: Secondary | ICD-10-CM | POA: Diagnosis not present

## 2019-07-10 DIAGNOSIS — K4020 Bilateral inguinal hernia, without obstruction or gangrene, not specified as recurrent: Secondary | ICD-10-CM

## 2019-07-10 DIAGNOSIS — D6862 Lupus anticoagulant syndrome: Secondary | ICD-10-CM

## 2019-07-10 NOTE — Progress Notes (Signed)
Virtual Visit via Video Note  I connected with Dylan Terry on 07/10/19 at  3:00 PM EDT by a video enabled telemedicine application and verified that I am speaking with the correct person using two identifiers.  Location patient: home Location provider: work office Persons participating in the virtual visit: patient, provider  I discussed the limitations of evaluation and management by telemedicine and the availability of in person appointments. The patient expressed understanding and agreed to proceed.   HPI: This is a scheduled visit to establish care and to discuss chronic conditions.  He is married, works in Nurse, learning disability.  Never smoker, alcohol only occasionally.  Past medical history is significant for ophthalmic migraines, history of protein C deficiency and lupus anticoagulant has been taking 81 mg of aspirin.  He has had amaurosis fugax in 2017.  In 2012 he had bilateral inguinal and umbilical hernia repair.  He would like to schedule himself for CPE.   ROS: Constitutional: Denies fever, chills, diaphoresis, appetite change and fatigue.  HEENT: Denies photophobia, eye pain, redness, hearing loss, ear pain, congestion, sore throat, rhinorrhea, sneezing, mouth sores, trouble swallowing, neck pain, neck stiffness and tinnitus.   Respiratory: Denies SOB, DOE, cough, chest tightness,  and wheezing.   Cardiovascular: Denies chest pain, palpitations and leg swelling.  Gastrointestinal: Denies nausea, vomiting, abdominal pain, diarrhea, constipation, blood in stool and abdominal distention.  Genitourinary: Denies dysuria, urgency, frequency, hematuria, flank pain and difficulty urinating.  Endocrine: Denies: hot or cold intolerance, sweats, changes in hair or nails, polyuria, polydipsia. Musculoskeletal: Denies myalgias, back pain, joint swelling, arthralgias and gait problem.  Skin: Denies pallor, rash and wound.  Neurological: Denies dizziness, seizures, syncope, weakness,  light-headedness, numbness and headaches.  Hematological: Denies adenopathy. Easy bruising, personal or family bleeding history  Psychiatric/Behavioral: Denies suicidal ideation, mood changes, confusion, nervousness, sleep disturbance and agitation   Past Medical History:  Diagnosis Date  . ALLERGIC RHINITIS 05/05/2009  . CHEST PAIN 10/12/2010  . HERNIA, UMBILICAL 02/05/3544  . OPHTHALMIC MIGRAINES, HX OF 05/05/2009    Past Surgical History:  Procedure Laterality Date  . HERNIA REPAIR  1962   inguinal    Family History  Problem Relation Age of Onset  . Heart disease Mother 15       cardiac dysrythmia  . Cancer Mother        leukemia  . Heart disease Father        MI  . Hyperlipidemia Father   . Thyroid disease Sister        3 sisters  . Transient ischemic attack Sister   . Thyroid disease Brother        5 brothers  . Cancer Paternal Aunt        breast  . Heart disease Paternal Grandfather        heart attack    SOCIAL HX:   reports that he has never smoked. He has never used smokeless tobacco. He reports that he does not drink alcohol or use drugs.   Current Outpatient Medications:  .  aspirin 81 MG tablet, Take 1 tablet (81 mg total) by mouth daily., Disp: 30 tablet, Rfl:  .  ibuprofen (ADVIL,MOTRIN) 200 MG tablet, Take 400 mg by mouth every 6 (six) hours as needed., Disp: , Rfl:   EXAM:   VITALS per patient if applicable: None reported  GENERAL: alert, oriented, appears well and in no acute distress  HEENT: atraumatic, conjunttiva clear, no obvious abnormalities on inspection of external nose  and ears, wears corrective lenses  NECK: normal movements of the head and neck  LUNGS: on inspection no signs of respiratory distress, breathing rate appears normal, no obvious gross increased work of breathing, gasping or wheezing  CV: no obvious cyanosis  MS: moves all visible extremities without noticeable abnormality  PSYCH/NEURO: pleasant and cooperative, no obvious  depression or anxiety, speech and thought processing grossly intact  ASSESSMENT AND PLAN:   Amaurosis fugax, left eye Protein C deficiency (HCC)  Lupus anticoagulant disorder (HCC) Ophthalmic migraine  -I have reviewed Dr. Calton Dach note from 2017.  At that time she said that he qualified for anticoagulant therapy but that he preferred to stay on aspirin for lifestyle reasons. -He is now ready to reconsider that decision, I will send him back to hematology for further discussion. -Will have him come in the office to check blood pressure and lipids. -He frequently has ophthalmic migraines that start with visual aura, while these may be migraines, I am concerned that it may also be a presentation of hyperviscosity syndrome.  Non-recurrent bilateral inguinal hernia without obstruction or gangrene -Status post repair without recurrence, he does have some pain with strenuous exercising.  Encounter for screening colonoscopy  -Ambulatory referral to GI.    I discussed the assessment and treatment plan with the patient. The patient was provided an opportunity to ask questions and all were answered. The patient agreed with the plan and demonstrated an understanding of the instructions.   The patient was advised to call back or seek an in-person evaluation if the symptoms worsen or if the condition fails to improve as anticipated.    Lelon Frohlich, MD  [facility]

## 2019-07-12 ENCOUNTER — Telehealth: Payer: Self-pay | Admitting: Hematology

## 2019-07-12 NOTE — Telephone Encounter (Signed)
lmom for patient to return call to office to confirm new patient appt 8/27 at 1145 am

## 2019-07-17 ENCOUNTER — Telehealth: Payer: Self-pay | Admitting: Hematology and Oncology

## 2019-07-17 NOTE — Telephone Encounter (Signed)
Scheduled appt per 8/11 sch message - pt aware of appt date and time

## 2019-07-19 ENCOUNTER — Telehealth: Payer: Self-pay | Admitting: *Deleted

## 2019-07-19 NOTE — Telephone Encounter (Signed)
Left message on machine for patient to schedule his physical.

## 2019-07-23 ENCOUNTER — Ambulatory Visit (AMBULATORY_SURGERY_CENTER): Payer: Self-pay

## 2019-07-23 ENCOUNTER — Other Ambulatory Visit: Payer: Self-pay

## 2019-07-23 ENCOUNTER — Inpatient Hospital Stay: Payer: 59 | Attending: Hematology and Oncology | Admitting: Hematology and Oncology

## 2019-07-23 VITALS — Ht 73.0 in | Wt 175.0 lb

## 2019-07-23 DIAGNOSIS — D6862 Lupus anticoagulant syndrome: Secondary | ICD-10-CM | POA: Diagnosis not present

## 2019-07-23 DIAGNOSIS — G453 Amaurosis fugax: Secondary | ICD-10-CM | POA: Insufficient documentation

## 2019-07-23 DIAGNOSIS — G43109 Migraine with aura, not intractable, without status migrainosus: Secondary | ICD-10-CM

## 2019-07-23 DIAGNOSIS — Z791 Long term (current) use of non-steroidal anti-inflammatories (NSAID): Secondary | ICD-10-CM | POA: Insufficient documentation

## 2019-07-23 DIAGNOSIS — D6859 Other primary thrombophilia: Secondary | ICD-10-CM | POA: Diagnosis not present

## 2019-07-23 DIAGNOSIS — Z7982 Long term (current) use of aspirin: Secondary | ICD-10-CM | POA: Insufficient documentation

## 2019-07-23 DIAGNOSIS — Z1211 Encounter for screening for malignant neoplasm of colon: Secondary | ICD-10-CM

## 2019-07-23 MED ORDER — SUPREP BOWEL PREP KIT 17.5-3.13-1.6 GM/177ML PO SOLN: 1.0000 | Freq: Once | ORAL | 0 refills | Status: AC

## 2019-07-23 NOTE — Progress Notes (Signed)
Per pt, no allergies to soy or egg products.Pt not taking any weight loss meds or using  O2 at home.  Pt denies sedation problems  Pt refused emmi video.

## 2019-07-25 ENCOUNTER — Encounter: Payer: Self-pay | Admitting: Hematology and Oncology

## 2019-07-25 NOTE — Assessment & Plan Note (Signed)
He had history of lupus anticoagulant detected on his blood work, performed 3 months apart We discussed the risk and benefits of pursuing repeat blood work We also discussed about the benefit of aspirin therapy After a long discussion, he is in agreement not to pursue repeat blood work

## 2019-07-25 NOTE — Progress Notes (Signed)
Dylan Terry OFFICE PROGRESS NOTE  Dylan Terry, Dylan Halsted, MD  ASSESSMENT & PLAN:  Protein C deficiency [facility]) We had extensive discussions about management of protein C deficiency The patient was wondering whether the testing was inaccurate We discussed the risk and benefits of repeat testing For now, I do not believe it changes management I do not feel strongly to recommend chronic anticoagulation therapy based on his prior history of amaurosis fugax I recommend risk factor modification and aspirin therapy He will need aggressive perioperative anticoagulation management in the future if he were to undergo elective surgery such as hip or knee surgeries in the future We also discussed the implication of genetic testing While protein C deficiency could be inheritable, it is not pathogenic, meaning that protein C deficiency does not predict recurrent blood clots For now, I do not recommend repeat testing in him or testing in his son.  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   INTERVAL  HISTORY: Dylan Terry 58 y.o. male returns for further follow-up and discussion about potential repeating protein C panel and lupus anticoagulant panel He started to have some intermittent headaches He denies recurrence of amaurosis fugax No recent diagnosis of blood clot The patient was undergoing tremendous stress 3 years ago due to family issues and was wondering whether his blood work was accurate at that time Currently, he is not aware of diagnosis of hypertension, hyperlipidemia, diabetes or heart disease He is still undergoing tremendous stress with work and only sleep 5 to 6 hours today Today, his blood pressure is mildly elevated at 145/83 He denies chest pain or shortness of breath on exertion He has not been exercising regularly at all  [place]:  Dylan Terry was seen in 2017 because of recent complaints of amaurosis fugax affecting his left eye. It lasted for approximately 15 seconds and was described as a curtain going down his left eye. He had complete recovery of his eye function and was placed on aspirin therapy since then. He was referred to see neurologist. On 04/25/2016, he had extensive blood work which came back borderline positive results for lupus anticoagulant and low protein C level. The patient has similar complaints in 2012 with numbness and tingling sensation affecting his right face in the right arm. He had extensive testing including echocardiogram, MRI and vascular ultrasound. He felt that his symptoms never completely go away and he has altered sensation on his left face. He was recommended to take aspirin but he only took it for a few weeks and discontinue since then. He has positive family history of TIA. The patient does not smoke. He does not have diabetes. He does not have  cardiovascular disease that he is aware of. He denies prior history of thrombosis/DVT/PE The patient was placed on aspirin therapy. Repeat blood work in August  2017 confirmed mild protein C deficiency and persistent lupus anticoagulant  I have reviewed the past medical history, past surgical history, social history and family history with the patient and they are unchanged from previous note.  ALLERGIES:  is allergic to erythromycin stearate.  MEDICATIONS:  Current Outpatient Medications  Medication Sig Dispense Refill  . acetaminophen (TYLENOL) 500 MG tablet Take 500 mg by mouth every 6 (six) hours as needed. Take 2 pills as needed    . aspirin 81 MG tablet Take 1 tablet (81 mg total) by mouth daily. 30 tablet   . ibuprofen (ADVIL,MOTRIN) 200 MG tablet Take 400 mg by mouth every 6 (six) hours as needed.     No current facility-administered medications for this visit.      REVIEW OF SYSTEMS:   Constitutional: Denies fevers, chills or night sweats Eyes: Denies blurriness of vision Ears, nose, mouth, throat, and face: Denies mucositis or sore throat Respiratory: Denies cough, dyspnea or wheezes Cardiovascular: Denies palpitation, chest discomfort or lower extremity swelling Gastrointestinal:  Denies nausea, heartburn or change in bowel habits Skin: Denies abnormal skin rashes Lymphatics: Denies new lymphadenopathy or easy bruising Neurological:Denies numbness, tingling or new weaknesses Behavioral/Psych: Mood is stable, no new changes  All other systems were reviewed with the patient and are negative.  PHYSICAL EXAMINATION: ECOG PERFORMANCE STATUS: 1 - Symptomatic but completely ambulatory  Vitals:   07/23/19 1029  BP: (!) 145/85  Pulse: (!) 53  Resp: 18  Temp: 98.3 F (36.8 C)  SpO2: 100%   Filed Weights   07/23/19 1029  Weight: 176 lb 6.4 oz (80 kg)    GENERAL:alert, no distress and comfortable Musculoskeletal:no cyanosis of digits and no clubbing  NEURO: alert & oriented x 3 with fluent speech, no focal motor/sensory deficits  I spent 25 minutes counseling the patient face to face. The total time spent in the appointment  was 30 minutes and more than 50% was on counseling.   All questions were answered. The patient knows to call the clinic with any problems, questions or concerns. No barriers to learning was detected.    Heath Lark, MD 8/20/202010:55 AM

## 2019-07-25 NOTE — Assessment & Plan Note (Addendum)
We had extensive discussions about management of protein C deficiency The patient was wondering whether the testing was inaccurate We discussed the risk and benefits of repeat testing For now, I do not believe it changes management I do not feel strongly to recommend chronic anticoagulation therapy based on his prior history of amaurosis fugax I recommend risk factor modification and aspirin therapy He will need aggressive perioperative anticoagulation management in the future if he were to undergo elective surgery such as hip or knee surgeries in the future We also discussed the implication of genetic testing While protein C deficiency could be inheritable, it is not pathogenic, meaning that protein C deficiency does not predict recurrent blood clots For now, I do not recommend repeat testing in him or testing in his son.

## 2019-07-25 NOTE — Assessment & Plan Note (Signed)
He has no recurrence of symptoms We discussed the importance of risk factor modification to prevent risk of thrombosis such as aggressive management of blood pressure, hyperlipidemia, lifestyle modification with regular exercise and sleep hygiene Ultimately, the patient is in agreement to try lifestyle modification along with aspirin therapy

## 2019-07-25 NOTE — Assessment & Plan Note (Signed)
He has frequent migraine headaches and is wondering whether this could be due to hypercoagulable disorder We also discussed about conservative management

## 2019-08-01 ENCOUNTER — Other Ambulatory Visit: Payer: Managed Care, Other (non HMO)

## 2019-08-01 ENCOUNTER — Ambulatory Visit: Payer: Managed Care, Other (non HMO) | Admitting: Hematology

## 2019-08-02 ENCOUNTER — Encounter: Payer: Self-pay | Admitting: Gastroenterology

## 2019-08-08 ENCOUNTER — Telehealth: Payer: Self-pay

## 2019-08-08 NOTE — Telephone Encounter (Signed)
Covid-19 screening questions   Do you now or have you had a fever in the last 14 days?  Do you have any respiratory symptoms of shortness of breath or cough now or in the last 14 days?  Do you have any family members or close contacts with diagnosed or suspected Covid-19 in the past 14 days?  Have you been tested for Covid-19 and found to be positive?       

## 2019-08-09 ENCOUNTER — Ambulatory Visit (AMBULATORY_SURGERY_CENTER): Payer: 59 | Admitting: Gastroenterology

## 2019-08-09 ENCOUNTER — Encounter: Payer: Self-pay | Admitting: Gastroenterology

## 2019-08-09 ENCOUNTER — Other Ambulatory Visit: Payer: Self-pay

## 2019-08-09 VITALS — BP 110/62 | HR 41 | Temp 98.5°F | Resp 12 | Ht 73.0 in | Wt 176.0 lb

## 2019-08-09 DIAGNOSIS — D123 Benign neoplasm of transverse colon: Secondary | ICD-10-CM

## 2019-08-09 DIAGNOSIS — Z1211 Encounter for screening for malignant neoplasm of colon: Secondary | ICD-10-CM | POA: Diagnosis present

## 2019-08-09 DIAGNOSIS — D120 Benign neoplasm of cecum: Secondary | ICD-10-CM | POA: Diagnosis not present

## 2019-08-09 DIAGNOSIS — D122 Benign neoplasm of ascending colon: Secondary | ICD-10-CM

## 2019-08-09 HISTORY — PX: COLONOSCOPY: SHX174

## 2019-08-09 MED ORDER — SODIUM CHLORIDE 0.9 % IV SOLN: 500.0000 mL | Freq: Once | INTRAVENOUS | Status: DC

## 2019-08-09 NOTE — Op Note (Signed)
[place] Patient Name: Dylan Terry Procedure Date: 08/09/2019 9:38 AM MRN: [id number] Endoscopist: Remo Lipps P. Havery [name] , MD Age: 58 Referring MD:  Date of Birth: 29-May-1961 Gender: Male Account #: [id number] Procedure:                Colonoscopy Indications:              Screening for colorectal malignant neoplasm, This                            is the patient's first colonoscopy Medicines:                Monitored Anesthesia Care Procedure:                Pre-Anesthesia Assessment:                           - Prior to the procedure, a History and Physical                            was performed, and patient medications and                            allergies were reviewed. The patient's tolerance of                            previous anesthesia was also reviewed. The risks                            and benefits of the procedure and the sedation                            options and risks were discussed with the patient.                            All questions were answered, and informed consent                            was obtained. Prior Anticoagulants: The patient has                            taken no previous anticoagulant or antiplatelet                            agents. ASA Grade Assessment: II - A patient with                            mild systemic disease. After reviewing the risks                            and benefits, the patient was deemed in                            satisfactory condition to undergo the procedure.  After obtaining informed consent, the colonoscope                            was passed under direct vision. Throughout the                            procedure, the patient's blood pressure, pulse, and                            oxygen saturations were monitored continuously. The                            Colonoscope was introduced through the anus and                            advanced to the the cecum,  identified by                            appendiceal orifice and ileocecal valve. The                            colonoscopy was performed without difficulty. The                            patient tolerated the procedure well. The quality                            of the bowel preparation was good. The ileocecal                            valve, appendiceal orifice, and rectum were                            photographed. Scope In: 9:45:43 AM Scope Out: 10:06:21 AM Scope Withdrawal Time: 0 hours 18 minutes 25 seconds  Total Procedure Duration: 0 hours 20 minutes 38 seconds  Findings:                 The perianal and digital rectal examinations were                            normal.                           A diminutive polyp was found in the cecum. The                            polyp was sessile. The polyp was removed with a                            cold snare. Resection and retrieval were complete.                           A diminutive polyp was found in the ascending  colon. The polyp was sessile. The polyp was removed                            with a cold snare. Resection and retrieval were                            complete.                           A diminutive polyp was found in the transverse                            colon. The polyp was sessile. The polyp was removed                            with a cold snare. Resection and retrieval were                            complete.                           Internal hemorrhoids were found during retroflexion.                           The exam was otherwise without abnormality. Complications:            No immediate complications. Estimated blood loss:                            Minimal. Estimated Blood Loss:     Estimated blood loss was minimal. Impression:               - One diminutive polyp in the cecum, removed with a                            cold snare. Resected and retrieved.                            - One diminutive polyp in the ascending colon,                            removed with a cold snare. Resected and retrieved.                           - One diminutive polyp in the transverse colon,                            removed with a cold snare. Resected and retrieved.                           - Internal hemorrhoids.                           - The examination was otherwise normal. Recommendation:           - Patient has a contact  number available for                            emergencies. The signs and symptoms of potential                            delayed complications were discussed with the                            patient. Return to normal activities tomorrow.                            Written discharge instructions were provided to the                            patient.                           - Resume previous diet.                           - Continue present medications.                           - Await pathology results. Remo Lipps P. Tenise Stetler, MD 08/09/2019 10:10:00 AM This report has been signed electronically.

## 2019-08-09 NOTE — Patient Instructions (Signed)
  HANDOUTS GIVEN : POLYPS AND HEMORRHOIDS  YOU HAD AN ENDOSCOPIC PROCEDURE TODAY AT [facility]:   Refer to the procedure report that was given to you for any specific questions about what was found during the examination.  If the procedure report does not answer your questions, please call your gastroenterologist to clarify.  If you requested that your care partner not be given the details of your procedure findings, then the procedure report has been included in a sealed envelope for you to review at your convenience later.  YOU SHOULD EXPECT: Some feelings of bloating in the abdomen. Passage of more gas than usual.  Walking can help get rid of the air that was put into your GI tract during the procedure and reduce the bloating. If you had a lower endoscopy (such as a colonoscopy or flexible sigmoidoscopy) you may notice spotting of blood in your stool or on the toilet paper. If you underwent a bowel prep for your procedure, you may not have a normal bowel movement for a few days.  Please Note:  You might notice some irritation and congestion in your nose or some drainage.  This is from the oxygen used during your procedure.  There is no need for concern and it should clear up in a day or so.  SYMPTOMS TO REPORT IMMEDIATELY:   Following lower endoscopy (colonoscopy or flexible sigmoidoscopy):  Excessive amounts of blood in the stool  Significant tenderness or worsening of abdominal pains  Swelling of the abdomen that is new, acute  Fever of 100F or higher   For urgent or emergent issues, a gastroenterologist can be reached at any hour by calling [phone].   DIET:  We do recommend a small meal at first, but then you may proceed to your regular diet.  Drink plenty of fluids but you should avoid alcoholic beverages for 24 hours.  ACTIVITY:  You should plan to take it easy for the rest of today and you should NOT DRIVE or use heavy machinery until tomorrow (because of the  sedation medicines used during the test).    FOLLOW UP: Our staff will call the number listed on your records 48-72 hours following your procedure to check on you and address any questions or concerns that you may have regarding the information given to you following your procedure. If we do not reach you, we will leave a message.  We will attempt to reach you two times.  During this call, we will ask if you have developed any symptoms of COVID 19. If you develop any symptoms (ie: fever, flu-like symptoms, shortness of breath, cough etc.) before then, please call [phone].  If you test positive for Covid 19 in the 2 weeks post procedure, please call and report this information to [place].    If any biopsies were taken you will be contacted by phone or by letter within the next 1-3 weeks.  Please call us at [phone] if you have not heard about the biopsies in 3 weeks.    SIGNATURES/CONFIDENTIALITY: You and/or your care partner have signed paperwork which will be entered into your electronic medical record.  These signatures attest to the fact that that the information above on your After Visit Summary has been reviewed and is understood.  Full responsibility of the confidentiality of this discharge information lies with you and/or your care-partner.

## 2019-08-09 NOTE — Progress Notes (Signed)
Called to room to assist during endoscopic procedure.  Patient ID and intended procedure confirmed with present staff. Received instructions for my participation in the procedure from the performing physician.  

## 2019-08-09 NOTE — Progress Notes (Signed)
A/ox3, pleased with MAC, report to RN 

## 2019-08-14 ENCOUNTER — Telehealth: Payer: Self-pay

## 2019-08-14 NOTE — Telephone Encounter (Signed)
  Follow up Call-  Call back number 08/09/2019  Post procedure Call Back phone  # [phone]  Permission to leave phone message Yes  Some recent data might be hidden     Patient questions:  Do you have a fever, pain , or abdominal swelling? No. Pain Score  0 *  Have you tolerated food without any problems? Yes.    Have you been able to return to your normal activities? Yes.    Do you have any questions about your discharge instructions: Diet   No. Medications  No. Follow up visit  No.  Do you have questions or concerns about your Care? No.  Actions: * If pain score is 4 or above: No action needed, pain <4.   1. Have you developed a fever since your procedure? no  2.   Have you had an respiratory symptoms (SOB or cough) since your procedure? no  3.   Have you tested positive for COVID 19 since your procedure no  4.   Have you had any family members/close contacts diagnosed with the COVID 19 since your procedure?  no   If yes to any of these questions please route to Joylene Kimani, RN and Alphonsa Gin, Therapist, sports.

## 2019-08-14 NOTE — Telephone Encounter (Signed)
Called [phone] and left a messaged we tried to reach pt for a follow up call. maw

## 2019-09-06 ENCOUNTER — Other Ambulatory Visit: Payer: Self-pay | Admitting: Internal Medicine

## 2019-09-06 ENCOUNTER — Encounter: Payer: Self-pay | Admitting: Internal Medicine

## 2019-09-06 ENCOUNTER — Other Ambulatory Visit: Payer: Self-pay

## 2019-09-06 ENCOUNTER — Ambulatory Visit (INDEPENDENT_AMBULATORY_CARE_PROVIDER_SITE_OTHER): Payer: 59 | Admitting: Internal Medicine

## 2019-09-06 VITALS — BP 110/68 | HR 50 | Temp 98.0°F | Ht 73.0 in | Wt 179.0 lb

## 2019-09-06 DIAGNOSIS — E559 Vitamin D deficiency, unspecified: Secondary | ICD-10-CM

## 2019-09-06 DIAGNOSIS — J302 Other seasonal allergic rhinitis: Secondary | ICD-10-CM

## 2019-09-06 DIAGNOSIS — G43109 Migraine with aura, not intractable, without status migrainosus: Secondary | ICD-10-CM | POA: Diagnosis not present

## 2019-09-06 DIAGNOSIS — Z23 Encounter for immunization: Secondary | ICD-10-CM | POA: Diagnosis not present

## 2019-09-06 DIAGNOSIS — E785 Hyperlipidemia, unspecified: Secondary | ICD-10-CM | POA: Insufficient documentation

## 2019-09-06 DIAGNOSIS — D6862 Lupus anticoagulant syndrome: Secondary | ICD-10-CM

## 2019-09-06 DIAGNOSIS — D6859 Other primary thrombophilia: Secondary | ICD-10-CM | POA: Diagnosis not present

## 2019-09-06 DIAGNOSIS — Z0000 Encounter for general adult medical examination without abnormal findings: Secondary | ICD-10-CM

## 2019-09-06 DIAGNOSIS — R7989 Other specified abnormal findings of blood chemistry: Secondary | ICD-10-CM

## 2019-09-06 LAB — LIPID PANEL
Cholesterol: 200 mg/dL (ref 0–200)
HDL: 48.1 mg/dL (ref >39.00)
LDL Cholesterol: 132 mg/dL — ABNORMAL HIGH (ref 0–99)
NonHDL: 151.79
Total CHOL/HDL Ratio: 4
Triglycerides: 99 mg/dL (ref 0.0–149.0)
VLDL: 19.8 mg/dL (ref 0.0–40.0)

## 2019-09-06 LAB — CBC WITH DIFFERENTIAL/PLATELET
Basophils Absolute: 0 10*3/uL (ref 0.0–0.1)
Basophils Relative: 0.4 % (ref 0.0–3.0)
Eosinophils Absolute: 0.1 10*3/uL (ref 0.0–0.7)
Eosinophils Relative: 2.4 % (ref 0.0–5.0)
HCT: 46.1 % (ref 39.0–52.0)
Hemoglobin: 15.3 g/dL (ref 13.0–17.0)
Lymphocytes Relative: 30.8 % (ref 12.0–46.0)
Lymphs Abs: 1 10*3/uL (ref 0.7–4.0)
MCHC: 33.1 g/dL (ref 30.0–36.0)
MCV: 91.5 fl (ref 78.0–100.0)
Monocytes Absolute: 0.3 10*3/uL (ref 0.1–1.0)
Monocytes Relative: 8.6 % (ref 3.0–12.0)
Neutro Abs: 1.9 10*3/uL (ref 1.4–7.7)
Neutrophils Relative %: 57.8 % (ref 43.0–77.0)
Platelets: 131 10*3/uL — ABNORMAL LOW (ref 150.0–400.0)
RBC: 5.04 Mil/uL (ref 4.22–5.81)
RDW: 13.3 % (ref 11.5–15.5)
WBC: 3.2 10*3/uL — ABNORMAL LOW (ref 4.0–10.5)

## 2019-09-06 LAB — COMPREHENSIVE METABOLIC PANEL
ALT: 22 U/L (ref 0–53)
AST: 19 U/L (ref 0–37)
Albumin: 4.6 g/dL (ref 3.5–5.2)
Alkaline Phosphatase: 72 U/L (ref 39–117)
BUN: 15 mg/dL (ref 6–23)
CO2: 30 mEq/L (ref 19–32)
Calcium: 9.7 mg/dL (ref 8.4–10.5)
Chloride: 104 mEq/L (ref 96–112)
Creatinine, Ser: 1.06 mg/dL (ref 0.40–1.50)
GFR: 71.61 mL/min (ref >60.00)
Glucose, Bld: 67 mg/dL — ABNORMAL LOW (ref 70–99)
Potassium: 3.9 mEq/L (ref 3.5–5.1)
Sodium: 141 mEq/L (ref 135–145)
Total Bilirubin: 0.8 mg/dL (ref 0.2–1.2)
Total Protein: 6.9 g/dL (ref 6.0–8.3)

## 2019-09-06 LAB — VITAMIN B12: Vitamin B-12: 632 pg/mL (ref 211–911)

## 2019-09-06 LAB — HEMOGLOBIN A1C: Hgb A1c MFr Bld: 5.6 % (ref 4.6–6.5)

## 2019-09-06 LAB — VITAMIN D 25 HYDROXY (VIT D DEFICIENCY, FRACTURES): VITD: 20.78 ng/mL — ABNORMAL LOW (ref 30.00–100.00)

## 2019-09-06 LAB — TSH: TSH: 1.87 u[IU]/mL (ref 0.35–4.50)

## 2019-09-06 MED ORDER — VITAMIN D (ERGOCALCIFEROL) 1.25 MG (50000 UNIT) PO CAPS: 50000.0000 [IU] | ORAL_CAPSULE | ORAL | 0 refills | Status: AC

## 2019-09-06 NOTE — Patient Instructions (Signed)
-Nice seeing you today!!  -Lab work today; will notify you once results are available.  -Flu and first shingles vaccine today.  -Make sure you schedule eye exam.  -Schedule follow up in 1 year or sooner as needed.   Preventive Care 24-57 Years Old, Male Preventive care refers to lifestyle choices and visits with your health care provider that can promote health and wellness. This includes:  A yearly physical exam. This is also called an annual well check.  Regular dental and eye exams.  Immunizations.  Screening for certain conditions.  Healthy lifestyle choices, such as eating a healthy diet, getting regular exercise, not using drugs or products that contain nicotine and tobacco, and limiting alcohol use. What can I expect for my preventive care visit? Physical exam Your health care provider will check:  Height and weight. These may be used to calculate body mass index (BMI), which is a measurement that tells if you are at a healthy weight.  Heart rate and blood pressure.  Your skin for abnormal spots. Counseling Your health care provider may ask you questions about:  Alcohol, tobacco, and drug use.  Emotional well-being.  Home and relationship well-being.  Sexual activity.  Eating habits.  Work and work Statistician. What immunizations do I need?  Influenza (flu) vaccine  This is recommended every year. Tetanus, diphtheria, and pertussis (Tdap) vaccine  You may need a Td booster every 10 years. Varicella (chickenpox) vaccine  You may need this vaccine if you have not already been vaccinated. Zoster (shingles) vaccine  You may need this after age 20. Measles, mumps, and rubella (MMR) vaccine  You may need at least one dose of MMR if you were born in 1957 or later. You may also need a second dose. Pneumococcal conjugate (PCV13) vaccine  You may need this if you have certain conditions and were not previously vaccinated. Pneumococcal polysaccharide  (PPSV23) vaccine  You may need one or two doses if you smoke cigarettes or if you have certain conditions. Meningococcal conjugate (MenACWY) vaccine  You may need this if you have certain conditions. Hepatitis A vaccine  You may need this if you have certain conditions or if you travel or work in places where you may be exposed to hepatitis A. Hepatitis B vaccine  You may need this if you have certain conditions or if you travel or work in places where you may be exposed to hepatitis B. Haemophilus influenzae type b (Hib) vaccine  You may need this if you have certain risk factors. Human papillomavirus (HPV) vaccine  If recommended by your health care provider, you may need three doses over 6 months. You may receive vaccines as individual doses or as more than one vaccine together in one shot (combination vaccines). Talk with your health care provider about the risks and benefits of combination vaccines. What tests do I need? Blood tests  Lipid and cholesterol levels. These may be checked every 5 years, or more frequently if you are over 69 years old.  Hepatitis C test.  Hepatitis B test. Screening  Lung cancer screening. You may have this screening every year starting at age 53 if you have a 30-pack-year history of smoking and currently smoke or have quit within the past 15 years.  Prostate cancer screening. Recommendations will vary depending on your family history and other risks.  Colorectal cancer screening. All adults should have this screening starting at age 24 and continuing until age 94. Your health care provider may recommend screening at  age 79 if you are at increased risk. You will have tests every 1-10 years, depending on your results and the type of screening test.  Diabetes screening. This is done by checking your blood sugar (glucose) after you have not eaten for a while (fasting). You may have this done every 1-3 years.  Sexually transmitted disease (STD)  testing. Follow these instructions at home: Eating and drinking  Eat a diet that includes fresh fruits and vegetables, whole grains, lean protein, and low-fat dairy products.  Take vitamin and mineral supplements as recommended by your health care provider.  Do not drink alcohol if your health care provider tells you not to drink.  If you drink alcohol: ? Limit how much you have to 0-2 drinks a day. ? Be aware of how much alcohol is in your drink. In the [place] one drink equals one 12 oz bottle of beer (355 mL), one 5 oz glass of wine (148 mL), or one 1 oz glass of hard liquor (44 mL). Lifestyle  Take daily care of your teeth and gums.  Stay active. Exercise for at least 30 minutes on 5 or more days each week.  Do not use any products that contain nicotine or tobacco, such as cigarettes, e-cigarettes, and chewing tobacco. If you need help quitting, ask your health care provider.  If you are sexually active, practice safe sex. Use a condom or other form of protection to prevent STIs (sexually transmitted infections).  Talk with your health care provider about taking a low-dose aspirin every day starting at age 42. What's next?  Go to your health care provider once a year for a well check visit.  Ask your health care provider how often you should have your eyes and teeth checked.  Stay up to date on all vaccines. This information is not intended to replace advice given to you by your health care provider. Make sure you discuss any questions you have with your health care provider. Document Released: 12/18/2015 Document Revised: 11/15/2018 Document Reviewed: 11/15/2018 Elsevier Patient Education  2020 [employer].

## 2019-09-06 NOTE — Progress Notes (Signed)
Established Patient Office Visit     CC/Reason for Visit: Annual preventive exam  HPI: Dylan Terry is a 58 y.o. male who is coming in today for the above mentioned reasons. Past Medical History is significant for: History of ophthalmic migraines as well as protein C deficiency and lupus anticoagulant.  He recently saw hematology who was not in favor for anticoagulation and instead recommended continued aspirin use.  He has routine dental care, no eye care.  He will receive flu and single vaccines today.  He had a colonoscopy last month and is a 5-year callback.   Past Medical/Surgical History: Past Medical History:  Diagnosis Date  . ALLERGIC RHINITIS 05/05/2009  . Allergy   . Blurred vision, left eye 2017   temporary/ did a MRI/negative   . CHEST PAIN 10/12/2010  . Clotting disorder ([place])    protein C def  . HERNIA, UMBILICAL 08/07/7341  . OPHTHALMIC MIGRAINES, HX OF 05/05/2009    Past Surgical History:  Procedure Laterality Date  . HERNIA REPAIR  1962   inguinalBil and umbilical at same time/ 2nd surgery in 2013    Social History:  reports that he has never smoked. He has never used smokeless tobacco. He reports that he does not drink alcohol or use drugs.  Allergies: Allergies  Allergen Reactions  . Erythromycin Stearate Nausea And Vomiting    Family History:  Family History  Problem Relation Age of Onset  . Heart disease Mother 21       cardiac dysrythmia  . Cancer Mother        leukemia  . Heart disease Father        MI  . Hyperlipidemia Father   . Thyroid disease Sister        3 sisters  . Transient ischemic attack Sister   . Cancer Paternal Aunt        breast  . Heart disease Paternal Grandfather        heart attack  . Thyroid disease Sister   . Healthy Sister   . Colon cancer Neg Hx   . Diabetes Neg Hx   . Rectal cancer Neg Hx   . Stomach cancer Neg Hx      Current Outpatient Medications:  .  acetaminophen (TYLENOL) 500 MG tablet, Take 500  mg by mouth every 6 (six) hours as needed. Take 2 pills as needed, Disp: , Rfl:  .  aspirin 81 MG tablet, Take 1 tablet (81 mg total) by mouth daily., Disp: 30 tablet, Rfl:  .  fluticasone (FLONASE) 50 MCG/ACT nasal spray, Place 50 sprays into both nostrils every morning., Disp: , Rfl:  .  ibuprofen (ADVIL,MOTRIN) 200 MG tablet, Take 400 mg by mouth every 6 (six) hours as needed., Disp: , Rfl:   Review of Systems:  Constitutional: Denies fever, chills, diaphoresis, appetite change and fatigue.  HEENT: Denies photophobia, eye pain, redness, hearing loss, ear pain, congestion, sore throat, rhinorrhea, sneezing, mouth sores, trouble swallowing, neck pain, neck stiffness and tinnitus.   Respiratory: Denies SOB, DOE, cough, chest tightness,  and wheezing.   Cardiovascular: Denies chest pain, palpitations and leg swelling.  Gastrointestinal: Denies nausea, vomiting, abdominal pain, diarrhea, constipation, blood in stool and abdominal distention.  Genitourinary: Denies dysuria, urgency, frequency, hematuria, flank pain and difficulty urinating.  Endocrine: Denies: hot or cold intolerance, sweats, changes in hair or nails, polyuria, polydipsia. Musculoskeletal: Denies myalgias, back pain, joint swelling, arthralgias and gait problem.  Skin: Denies pallor, rash and wound.  Neurological: Denies dizziness, seizures, syncope, weakness, light-headedness, numbness and headaches.  Hematological: Denies adenopathy. Easy bruising, personal or family bleeding history  Psychiatric/Behavioral: Denies suicidal ideation, mood changes, confusion, nervousness, sleep disturbance and agitation    Physical Exam: Vitals:   09/06/19 0831  BP: 110/68  Pulse: (!) 50  Temp: 98 F (36.7 C)  TempSrc: Temporal  SpO2: 97%  Weight: 179 lb (81.2 kg)  Height: '6\' 1"'$  (1.854 m)    Body mass index is 23.62 kg/m.   Constitutional: NAD, calm, comfortable Eyes: PERRL, lids and conjunctivae normal ENMT: Mucous membranes  are moist.Tympanic membrane is pearly white, no erythema or bulging. Neck: normal, supple, no masses, no thyromegaly Respiratory: clear to auscultation bilaterally, no wheezing, no crackles. Normal respiratory effort. No accessory muscle use.  Cardiovascular: Regular rate and rhythm, no murmurs / rubs / gallops. No extremity edema. 2+ pedal pulses. No carotid bruits.  Abdomen: no tenderness, no masses palpated. No hepatosplenomegaly. Bowel sounds positive.  Musculoskeletal: no clubbing / cyanosis. No joint deformity upper and lower extremities. Good ROM, no contractures. Normal muscle tone.  Skin: no rashes, lesions, ulcers. No induration Neurologic: CN 2-12 grossly intact. Sensation intact, DTR normal. Strength 5/5 in all 4.  Psychiatric: Normal judgment and insight. Alert and oriented x 3. Normal mood.    Impression and Plan:  Encounter for preventive health examination -He has routine dental care, recommend routine eye care. -To receive flu and first shingles vaccine today, otherwise immunizations are up-to-date and age-appropriate. -Screening labs to be done today. -Healthy lifestyle has been discussed in detail. -He had a colonoscopy last month and is a 5-year callback due to polyps. -We have discussed current prostate cancer screening guidelines, after reviewing risk factor profile we have decided to forego screening this year.  Seasonal allergic rhinitis, unspecified trigger -On Flonase.  Ophthalmic migraine -He has started tracking his migraines, they are now occurring about every month and half to 2 months. -I have offered neurology referral, he will let me know if he would like this.  Protein C deficiency ([place])  Lupus anticoagulant disorder ([place])  -He recently saw hematology who was against full anticoagulation and recommended continued aspirin.   Patient Instructions  -Nice seeing you today!!  -Lab work today; will notify you once results are available.  -Flu and  first shingles vaccine today.  -Make sure you schedule eye exam.  -Schedule follow up in 1 year or sooner as needed.   Preventive Care 18-56 Years Old, Male Preventive care refers to lifestyle choices and visits with your health care provider that can promote health and wellness. This includes:  A yearly physical exam. This is also called an annual well check.  Regular dental and eye exams.  Immunizations.  Screening for certain conditions.  Healthy lifestyle choices, such as eating a healthy diet, getting regular exercise, not using drugs or products that contain nicotine and tobacco, and limiting alcohol use. What can I expect for my preventive care visit? Physical exam Your health care provider will check:  Height and weight. These may be used to calculate body mass index (BMI), which is a measurement that tells if you are at a healthy weight.  Heart rate and blood pressure.  Your skin for abnormal spots. Counseling Your health care provider may ask you questions about:  Alcohol, tobacco, and drug use.  Emotional well-being.  Home and relationship well-being.  Sexual activity.  Eating habits.  Work and work Statistician. What immunizations do I need?  Influenza (flu) vaccine  This is recommended every year. Tetanus, diphtheria, and pertussis (Tdap) vaccine  You may need a Td booster every 10 years. Varicella (chickenpox) vaccine  You may need this vaccine if you have not already been vaccinated. Zoster (shingles) vaccine  You may need this after age 28. Measles, mumps, and rubella (MMR) vaccine  You may need at least one dose of MMR if you were born in 1957 or later. You may also need a second dose. Pneumococcal conjugate (PCV13) vaccine  You may need this if you have certain conditions and were not previously vaccinated. Pneumococcal polysaccharide (PPSV23) vaccine  You may need one or two doses if you smoke cigarettes or if you have certain  conditions. Meningococcal conjugate (MenACWY) vaccine  You may need this if you have certain conditions. Hepatitis A vaccine  You may need this if you have certain conditions or if you travel or work in places where you may be exposed to hepatitis A. Hepatitis B vaccine  You may need this if you have certain conditions or if you travel or work in places where you may be exposed to hepatitis B. Haemophilus influenzae type b (Hib) vaccine  You may need this if you have certain risk factors. Human papillomavirus (HPV) vaccine  If recommended by your health care provider, you may need three doses over 6 months. You may receive vaccines as individual doses or as more than one vaccine together in one shot (combination vaccines). Talk with your health care provider about the risks and benefits of combination vaccines. What tests do I need? Blood tests  Lipid and cholesterol levels. These may be checked every 5 years, or more frequently if you are over 79 years old.  Hepatitis C test.  Hepatitis B test. Screening  Lung cancer screening. You may have this screening every year starting at age 31 if you have a 30-pack-year history of smoking and currently smoke or have quit within the past 15 years.  Prostate cancer screening. Recommendations will vary depending on your family history and other risks.  Colorectal cancer screening. All adults should have this screening starting at age 34 and continuing until age 53. Your health care provider may recommend screening at age 27 if you are at increased risk. You will have tests every 1-10 years, depending on your results and the type of screening test.  Diabetes screening. This is done by checking your blood sugar (glucose) after you have not eaten for a while (fasting). You may have this done every 1-3 years.  Sexually transmitted disease (STD) testing. Follow these instructions at home: Eating and drinking  Eat a diet that includes fresh  fruits and vegetables, whole grains, lean protein, and low-fat dairy products.  Take vitamin and mineral supplements as recommended by your health care provider.  Do not drink alcohol if your health care provider tells you not to drink.  If you drink alcohol: ? Limit how much you have to 0-2 drinks a day. ? Be aware of how much alcohol is in your drink. In the [place] one drink equals one 12 oz bottle of beer (355 mL), one 5 oz glass of wine (148 mL), or one 1 oz glass of hard liquor (44 mL). Lifestyle  Take daily care of your teeth and gums.  Stay active. Exercise for at least 30 minutes on 5 or more days each week.  Do not use any products that contain nicotine or tobacco, such as cigarettes, e-cigarettes, and chewing tobacco. If you need help quitting,  ask your health care provider.  If you are sexually active, practice safe sex. Use a condom or other form of protection to prevent STIs (sexually transmitted infections).  Talk with your health care provider about taking a low-dose aspirin every day starting at age 9. What's next?  Go to your health care provider once a year for a well check visit.  Ask your health care provider how often you should have your eyes and teeth checked.  Stay up to date on all vaccines. This information is not intended to replace advice given to you by your health care provider. Make sure you discuss any questions you have with your health care provider. Document Released: 12/18/2015 Document Revised: 11/15/2018 Document Reviewed: 11/15/2018 Elsevier Patient Education  2020 [place], [place] [facility]

## 2019-09-06 NOTE — Addendum Note (Signed)
Addended by: Westley Hummer B on: 09/06/2019 05:39 PM   Modules accepted: Orders

## 2019-09-06 NOTE — Addendum Note (Signed)
Addended by: Suzette Battiest on: 09/06/2019 09:25 AM   Modules accepted: Orders

## 2019-11-15 ENCOUNTER — Telehealth: Payer: Self-pay | Admitting: *Deleted

## 2019-11-15 NOTE — Telephone Encounter (Signed)
Once he finishes his Vit D he will need to have his levels rechecked to determine future dosing. Orders are already in.  [place]

## 2019-11-15 NOTE — Telephone Encounter (Signed)
Last shingles vaccine was in October. Will call patient to schedule nurse visit.  Please advise on Vitamin D

## 2019-11-15 NOTE — Telephone Encounter (Signed)
Copied from [place] [id number]. Topic: General - Inquiry >> Nov 15, 2019  2:20 PM Dylan Terry, [place] wrote: Reason for CRM: Patient called in stating he is needing to have the 2nd shingrix vaccine. Pt also is wondering what will change for his vitamin D pill for the next dose. Please advise.

## 2019-11-18 NOTE — Telephone Encounter (Signed)
Left message for patient to return call to the office.

## 2019-11-20 NOTE — Telephone Encounter (Signed)
Spoke with patient on 11/19/2019. Patient given recommendations per Dr. Jerilee Hoh and verbalized understanding.

## 2019-11-23 ENCOUNTER — Other Ambulatory Visit: Payer: Self-pay | Admitting: Internal Medicine

## 2019-11-23 DIAGNOSIS — E559 Vitamin D deficiency, unspecified: Secondary | ICD-10-CM

## 2019-11-25 ENCOUNTER — Other Ambulatory Visit: Payer: 59

## 2019-12-02 ENCOUNTER — Ambulatory Visit (INDEPENDENT_AMBULATORY_CARE_PROVIDER_SITE_OTHER): Payer: 59

## 2019-12-02 ENCOUNTER — Other Ambulatory Visit (INDEPENDENT_AMBULATORY_CARE_PROVIDER_SITE_OTHER): Payer: 59

## 2019-12-02 ENCOUNTER — Other Ambulatory Visit: Payer: Self-pay

## 2019-12-02 DIAGNOSIS — R7989 Other specified abnormal findings of blood chemistry: Secondary | ICD-10-CM | POA: Diagnosis not present

## 2019-12-02 DIAGNOSIS — Z23 Encounter for immunization: Secondary | ICD-10-CM

## 2019-12-02 DIAGNOSIS — E559 Vitamin D deficiency, unspecified: Secondary | ICD-10-CM

## 2019-12-02 LAB — CBC WITH DIFFERENTIAL/PLATELET
Basophils Absolute: 0 10*3/uL (ref 0.0–0.1)
Basophils Relative: 0.4 % (ref 0.0–3.0)
Eosinophils Absolute: 0.1 10*3/uL (ref 0.0–0.7)
Eosinophils Relative: 2.1 % (ref 0.0–5.0)
HCT: 47.1 % (ref 39.0–52.0)
Hemoglobin: 15.6 g/dL (ref 13.0–17.0)
Lymphocytes Relative: 35.9 % (ref 12.0–46.0)
Lymphs Abs: 1.3 10*3/uL (ref 0.7–4.0)
MCHC: 33.1 g/dL (ref 30.0–36.0)
MCV: 91.2 fl (ref 78.0–100.0)
Monocytes Absolute: 0.2 10*3/uL (ref 0.1–1.0)
Monocytes Relative: 6.8 % (ref 3.0–12.0)
Neutro Abs: 2 10*3/uL (ref 1.4–7.7)
Neutrophils Relative %: 54.8 % (ref 43.0–77.0)
Platelets: 130 10*3/uL — ABNORMAL LOW (ref 150.0–400.0)
RBC: 5.17 Mil/uL (ref 4.22–5.81)
RDW: 13.4 % (ref 11.5–15.5)
WBC: 3.7 10*3/uL — ABNORMAL LOW (ref 4.0–10.5)

## 2019-12-02 LAB — VITAMIN D 25 HYDROXY (VIT D DEFICIENCY, FRACTURES): VITD: 34.09 ng/mL (ref 30.00–100.00)

## 2019-12-02 NOTE — Progress Notes (Signed)
Per orders of Dr. Ethlyn Gallery, injection of Shingrix given by Franco Collet. Patient tolerated injection well.

## 2019-12-03 ENCOUNTER — Other Ambulatory Visit: Payer: Self-pay | Admitting: Internal Medicine

## 2019-12-03 DIAGNOSIS — E559 Vitamin D deficiency, unspecified: Secondary | ICD-10-CM

## 2019-12-03 MED ORDER — VITAMIN D (ERGOCALCIFEROL) 1.25 MG (50000 UNIT) PO CAPS: 50000.0000 [IU] | ORAL_CAPSULE | ORAL | 0 refills | Status: AC

## 2020-02-19 ENCOUNTER — Other Ambulatory Visit: Payer: Self-pay | Admitting: Internal Medicine

## 2020-02-19 DIAGNOSIS — E559 Vitamin D deficiency, unspecified: Secondary | ICD-10-CM

## 2020-03-03 ENCOUNTER — Telehealth: Payer: Self-pay | Admitting: Internal Medicine

## 2020-03-03 NOTE — Telephone Encounter (Signed)
Pt states he finished with the Vitamin D prescription on 3/20 and wants to know what to do from here?   Pt can be reached at [phone]

## 2020-03-04 NOTE — Telephone Encounter (Signed)
Left message on machine for patient return our call.  Patient should schedule a lab appointment to have his Vit D level checked. Order placed.

## 2020-03-05 NOTE — Telephone Encounter (Signed)
Lab appointment scheduled 

## 2020-03-07 ENCOUNTER — Ambulatory Visit: Payer: 59 | Attending: Internal Medicine

## 2020-03-07 DIAGNOSIS — Z23 Encounter for immunization: Secondary | ICD-10-CM

## 2020-03-07 NOTE — Progress Notes (Signed)
   Covid-19 Vaccination Clinic  Name:  Dylan Terry    MRN: [id number] DOB: [birth year]  03/07/2020  [name] was observed post Covid-19 immunization for 15 minutes without incident. He was provided with Vaccine Information Sheet and instruction to access the V-Safe system.   [name] was instructed to call 911 with any severe reactions post vaccine: Marland Kitchen Difficulty breathing  . Swelling of face and throat  . A fast heartbeat  . A bad rash all over body  . Dizziness and weakness   Immunizations Administered    Name Date Dose VIS Date Route   Pfizer COVID-19 Vaccine 03/07/2020 11:33 AM 0.3 mL 11/15/2019 Intramuscular   Manufacturer: [place]   Lot: [id number]   [place]: [id number]

## 2020-03-09 ENCOUNTER — Other Ambulatory Visit: Payer: Self-pay

## 2020-03-09 ENCOUNTER — Other Ambulatory Visit (INDEPENDENT_AMBULATORY_CARE_PROVIDER_SITE_OTHER): Payer: 59

## 2020-03-09 DIAGNOSIS — E559 Vitamin D deficiency, unspecified: Secondary | ICD-10-CM

## 2020-03-10 LAB — VITAMIN D 25 HYDROXY (VIT D DEFICIENCY, FRACTURES): VITD: 49.12 ng/mL (ref 30.00–100.00)

## 2020-03-11 ENCOUNTER — Telehealth: Payer: Self-pay | Admitting: Internal Medicine

## 2020-03-11 MED ORDER — FLUTICASONE PROPIONATE 50 MCG/ACT NA SUSP: 2.0000 | Freq: Every morning | NASAL | 2 refills | Status: DC

## 2020-03-11 NOTE — Telephone Encounter (Signed)
Pt need a prescription for fluticasone (FLONASE) 50 MCG/ACT nasal sent to  [id number] - [place], [place] - [address] Phone:  [phone]  Fax:  [phone]

## 2020-03-31 ENCOUNTER — Ambulatory Visit: Payer: 59 | Attending: Internal Medicine

## 2020-03-31 DIAGNOSIS — Z23 Encounter for immunization: Secondary | ICD-10-CM

## 2020-03-31 NOTE — Progress Notes (Signed)
   Covid-19 Vaccination Clinic  Name:  Dylan Terry    MRN: [id number] DOB: Sep 26, 1961  03/31/2020  [name] was observed post Covid-19 immunization for 15 minutes without incident. He was provided with Vaccine Information Sheet and instruction to access the V-Safe system.   [name] was instructed to call 911 with any severe reactions post vaccine: Marland Kitchen Difficulty breathing  . Swelling of face and throat  . A fast heartbeat  . A bad rash all over body  . Dizziness and weakness   Immunizations Administered    Name Date Dose VIS Date Route   Pfizer COVID-19 Vaccine 03/31/2020 10:37 AM 0.3 mL 01/29/2019 Intramuscular   Manufacturer: [place]   Lot: [id number]   [place]: [id number]

## 2020-05-13 ENCOUNTER — Ambulatory Visit: Payer: 59 | Admitting: Internal Medicine

## 2020-05-13 ENCOUNTER — Other Ambulatory Visit: Payer: Self-pay

## 2020-05-13 ENCOUNTER — Encounter: Payer: Self-pay | Admitting: Internal Medicine

## 2020-05-13 VITALS — BP 118/72 | HR 54 | Temp 98.1°F | Ht 73.0 in | Wt 178.0 lb

## 2020-05-13 DIAGNOSIS — M7989 Other specified soft tissue disorders: Secondary | ICD-10-CM | POA: Diagnosis not present

## 2020-05-13 NOTE — Progress Notes (Signed)
Acute office Visit     This visit occurred during the SARS-CoV-2 public health emergency.  Safety protocols were in place, including screening questions prior to the visit, additional usage of staff PPE, and extensive cleaning of exam room while observing appropriate contact time as indicated for disinfecting solutions.    CC/Reason for Visit: Left armpit swelling  HPI: Dylan Terry is a 59 y.o. male who is coming in today for the above mentioned reasons.  He has concerns about some left armpit swelling that he has noticed for the past 4 days.  He has no pain, no fever, no nipple discharge.  He has had no recent vaccinations.  His last Covid vaccine was in March.  His mother has a history of leukemia and his niece was just diagnosed with lymphoma so this has prompted his visit today.   Past Medical/Surgical History: Past Medical History:  Diagnosis Date   ALLERGIC RHINITIS 05/05/2009   Allergy    Blurred vision, left eye 2017   temporary/ did a MRI/negative    CHEST PAIN 10/12/2010   Clotting disorder ([place])    protein C def   HERNIA, UMBILICAL 06/07/2594   OPHTHALMIC MIGRAINES, HX OF 05/05/2009    Past Surgical History:  Procedure Laterality Date   HERNIA REPAIR  24-May-1961   inguinalBil and umbilical at same time/ 2nd surgery in 2013    Social History:  reports that he has never smoked. He has never used smokeless tobacco. He reports that he does not drink alcohol or use drugs.  Allergies: Allergies  Allergen Reactions   Erythromycin Stearate Nausea And Vomiting    Family History:  Family History  Problem Relation Age of Onset   Heart disease Mother 71       cardiac dysrythmia   Cancer Mother        leukemia   Heart disease Father        MI   Hyperlipidemia Father    Thyroid disease Sister        3 sisters   Transient ischemic attack Sister    Cancer Paternal Aunt        breast   Heart disease Paternal Grandfather        heart attack   Thyroid  disease Sister    Healthy Sister    Colon cancer Neg Hx    Diabetes Neg Hx    Rectal cancer Neg Hx    Stomach cancer Neg Hx      Current Outpatient Medications:    aspirin 81 MG tablet, Take 1 tablet (81 mg total) by mouth daily., Disp: 30 tablet, Rfl:    fluticasone (FLONASE) 50 MCG/ACT nasal spray, Place 2 sprays into both nostrils every morning., Disp: 16 mL, Rfl: 2   ibuprofen (ADVIL,MOTRIN) 200 MG tablet, Take 400 mg by mouth every 6 (six) hours as needed., Disp: , Rfl:   Review of Systems:  Constitutional: Denies fever, chills, diaphoresis, appetite change and fatigue.  HEENT: Denies photophobia, eye pain, redness, hearing loss, ear pain, congestion, sore throat, rhinorrhea, sneezing, mouth sores, trouble swallowing, neck pain, neck stiffness and tinnitus.   Respiratory: Denies SOB, DOE, cough, chest tightness,  and wheezing.   Cardiovascular: Denies chest pain, palpitations and leg swelling.  Gastrointestinal: Denies nausea, vomiting, abdominal pain, diarrhea, constipation, blood in stool and abdominal distention.  Genitourinary: Denies dysuria, urgency, frequency, hematuria, flank pain and difficulty urinating.  Endocrine: Denies: hot or cold intolerance, sweats, changes in hair or nails, polyuria, polydipsia.  Musculoskeletal: Denies myalgias, back pain, joint swelling, arthralgias and gait problem.  Skin: Denies pallor, rash and wound.  Neurological: Denies dizziness, seizures, syncope, weakness, light-headedness, numbness and headaches.  Hematological: Denies adenopathy. Easy bruising, personal or family bleeding history  Psychiatric/Behavioral: Denies suicidal ideation, mood changes, confusion, nervousness, sleep disturbance and agitation    Physical Exam: Vitals:   05/13/20 1415  BP: 118/72  Pulse: (!) 54  Temp: 98.1 F (36.7 C)  TempSrc: Other (Comment)  SpO2: 99%  Weight: 178 lb (80.7 kg)  Height: 6\' 1"  (1.854 m)    Body mass index is 23.48  kg/m.   Constitutional: NAD, calm, comfortable Eyes: PERRL, lids and conjunctivae normal ENMT: Mucous membranes are moist.  Skin: No appreciable lymphadenopathy or soft tissue swelling under his left armpit. Neurologic: Grossly intact and nonfocal Psychiatric: Normal judgment and insight. Alert and oriented x 3. Normal mood.    Impression and Plan:  Swelling in left armpit -No edema or lymphadenopathy is apparent on examination today.  Advised continued observation and he will notify us of any changes.     Lelon Frohlich, MD [facility]

## 2021-04-17 ENCOUNTER — Other Ambulatory Visit: Payer: Self-pay | Admitting: Internal Medicine

## 2021-05-19 ENCOUNTER — Other Ambulatory Visit: Payer: Self-pay | Admitting: Internal Medicine

## 2021-08-27 NOTE — Progress Notes (Unsigned)
RN successfully faxed surgical clearance to [place].  [phone]

## 2022-01-17 DIAGNOSIS — M950 Acquired deformity of nose: Secondary | ICD-10-CM | POA: Diagnosis not present

## 2022-01-17 DIAGNOSIS — J3489 Other specified disorders of nose and nasal sinuses: Secondary | ICD-10-CM | POA: Diagnosis not present

## 2022-01-17 DIAGNOSIS — J343 Hypertrophy of nasal turbinates: Secondary | ICD-10-CM | POA: Diagnosis not present

## 2022-01-17 DIAGNOSIS — J342 Deviated nasal septum: Secondary | ICD-10-CM | POA: Diagnosis not present

## 2022-04-28 DIAGNOSIS — Z09 Encounter for follow-up examination after completed treatment for conditions other than malignant neoplasm: Secondary | ICD-10-CM | POA: Diagnosis not present

## 2022-04-28 DIAGNOSIS — Z8739 Personal history of other diseases of the musculoskeletal system and connective tissue: Secondary | ICD-10-CM | POA: Diagnosis not present

## 2022-04-28 DIAGNOSIS — Z8709 Personal history of other diseases of the respiratory system: Secondary | ICD-10-CM | POA: Diagnosis not present

## 2022-05-06 ENCOUNTER — Telehealth: Payer: Self-pay | Admitting: Internal Medicine

## 2022-05-06 ENCOUNTER — Ambulatory Visit (INDEPENDENT_AMBULATORY_CARE_PROVIDER_SITE_OTHER): Payer: BC Managed Care – PPO | Admitting: Family Medicine

## 2022-05-06 ENCOUNTER — Encounter: Payer: Self-pay | Admitting: Family Medicine

## 2022-05-06 DIAGNOSIS — G43109 Migraine with aura, not intractable, without status migrainosus: Secondary | ICD-10-CM | POA: Diagnosis not present

## 2022-05-06 DIAGNOSIS — G43909 Migraine, unspecified, not intractable, without status migrainosus: Secondary | ICD-10-CM | POA: Insufficient documentation

## 2022-05-06 DIAGNOSIS — J029 Acute pharyngitis, unspecified: Secondary | ICD-10-CM

## 2022-05-06 LAB — POCT RAPID STREP A (OFFICE): Rapid Strep A Screen: NEGATIVE

## 2022-05-06 MED ORDER — CEFUROXIME AXETIL 500 MG PO TABS: 500.0000 mg | ORAL_TABLET | Freq: Two times a day (BID) | ORAL | 0 refills | Status: AC

## 2022-05-06 MED ORDER — SUMATRIPTAN SUCCINATE 100 MG PO TABS: 100.0000 mg | ORAL_TABLET | ORAL | 11 refills | Status: DC | PRN

## 2022-05-06 NOTE — Telephone Encounter (Signed)
Pt stated he would like to transfer his care from [facility] to [facility]. I let pt know that I will send a msg back to the providers to let them know.   Please advise.

## 2022-05-06 NOTE — Progress Notes (Signed)
   Subjective:    Patient ID: Dylan Terry, male    DOB: 07-28-1961, 61 y.o.   MRN: [id number]  HPI Here for several issues. First he has had 3 days of ST, stuffy head, PND, and a dry cough. His wife is being treated for a strep throat infection, and she has the same symptoms. Also he asks about migraines. He averages a migraine headache about every 4-6 weeks. They last from 1-2 days each. He often has auras beforehand, either with ocular or abdominal symptoms. He usually takes Tylenol and lies down.    Review of Systems  Constitutional: Negative.   HENT:  Positive for congestion, postnasal drip and sore throat. Negative for ear pain and sinus pressure.   Eyes: Negative.   Respiratory:  Positive for cough. Negative for shortness of breath and wheezing.   Gastrointestinal: Negative.   Neurological:  Positive for headaches.      Objective:   Physical Exam Constitutional:      Appearance: Normal appearance. He is not ill-appearing.  HENT:     Right Ear: Tympanic membrane, ear canal and external ear normal.     Left Ear: Tympanic membrane, ear canal and external ear normal.     Nose: Nose normal.     Mouth/Throat:     Pharynx: Posterior oropharyngeal erythema present. No oropharyngeal exudate.  Eyes:     Conjunctiva/sclera: Conjunctivae normal.  Pulmonary:     Effort: Pulmonary effort is normal.     Breath sounds: Normal breath sounds.  Lymphadenopathy:     Cervical: No cervical adenopathy.  Neurological:     General: No focal deficit present.     Mental Status: He is alert and oriented to person, place, and time.          Assessment & Plan:  He likely has a strep throat infection, so we will treat him with 10 days of Cefuroxime. For the migraines, he will try Sumatriptan as needed. Alysia Penna, MD

## 2022-05-10 NOTE — Telephone Encounter (Signed)
Please advise 

## 2022-05-10 NOTE — Telephone Encounter (Signed)
Ok with me too

## 2022-05-13 NOTE — Telephone Encounter (Signed)
Pt has been scheduled for a CPE with Dr Sarajane Jews on 06/08/2022

## 2022-06-08 ENCOUNTER — Ambulatory Visit (INDEPENDENT_AMBULATORY_CARE_PROVIDER_SITE_OTHER): Payer: BC Managed Care – PPO | Admitting: Family Medicine

## 2022-06-08 ENCOUNTER — Encounter: Payer: Self-pay | Admitting: Family Medicine

## 2022-06-08 VITALS — BP 124/80 | HR 52 | Temp 98.7°F | Ht 72.75 in | Wt 182.0 lb

## 2022-06-08 DIAGNOSIS — Z0000 Encounter for general adult medical examination without abnormal findings: Secondary | ICD-10-CM | POA: Diagnosis not present

## 2022-06-08 LAB — CBC WITH DIFFERENTIAL/PLATELET
Basophils Absolute: 0 10*3/uL (ref 0.0–0.1)
Basophils Relative: 0.6 % (ref 0.0–3.0)
Eosinophils Absolute: 0.1 10*3/uL (ref 0.0–0.7)
Eosinophils Relative: 2.1 % (ref 0.0–5.0)
HCT: 45.1 % (ref 39.0–52.0)
Hemoglobin: 15.1 g/dL (ref 13.0–17.0)
Lymphocytes Relative: 37.5 % (ref 12.0–46.0)
Lymphs Abs: 1.2 10*3/uL (ref 0.7–4.0)
MCHC: 33.4 g/dL (ref 30.0–36.0)
MCV: 90.5 fl (ref 78.0–100.0)
Monocytes Absolute: 0.3 10*3/uL (ref 0.1–1.0)
Monocytes Relative: 8.8 % (ref 3.0–12.0)
Neutro Abs: 1.6 10*3/uL (ref 1.4–7.7)
Neutrophils Relative %: 51 % (ref 43.0–77.0)
Platelets: 122 10*3/uL — ABNORMAL LOW (ref 150.0–400.0)
RBC: 4.99 Mil/uL (ref 4.22–5.81)
RDW: 13.3 % (ref 11.5–15.5)
WBC: 3.2 10*3/uL — ABNORMAL LOW (ref 4.0–10.5)

## 2022-06-08 LAB — BASIC METABOLIC PANEL
BUN: 19 mg/dL (ref 6–23)
CO2: 27 mEq/L (ref 19–32)
Calcium: 9.2 mg/dL (ref 8.4–10.5)
Chloride: 104 mEq/L (ref 96–112)
Creatinine, Ser: 1.16 mg/dL (ref 0.40–1.50)
GFR: 68.07 mL/min (ref >60.00)
Glucose, Bld: 90 mg/dL (ref 70–99)
Potassium: 4.2 mEq/L (ref 3.5–5.1)
Sodium: 139 mEq/L (ref 135–145)

## 2022-06-08 LAB — LIPID PANEL
Cholesterol: 204 mg/dL — ABNORMAL HIGH (ref 0–200)
HDL: 47.6 mg/dL (ref >39.00)
LDL Cholesterol: 139 mg/dL — ABNORMAL HIGH (ref 0–99)
NonHDL: 156.29
Total CHOL/HDL Ratio: 4
Triglycerides: 87 mg/dL (ref 0.0–149.0)
VLDL: 17.4 mg/dL (ref 0.0–40.0)

## 2022-06-08 LAB — HEPATIC FUNCTION PANEL
ALT: 22 U/L (ref 0–53)
AST: 19 U/L (ref 0–37)
Albumin: 4.5 g/dL (ref 3.5–5.2)
Alkaline Phosphatase: 71 U/L (ref 39–117)
Bilirubin, Direct: 0.1 mg/dL (ref 0.0–0.3)
Total Bilirubin: 0.6 mg/dL (ref 0.2–1.2)
Total Protein: 6.6 g/dL (ref 6.0–8.3)

## 2022-06-08 LAB — HEMOGLOBIN A1C: Hgb A1c MFr Bld: 5.5 % (ref 4.6–6.5)

## 2022-06-08 LAB — TSH: TSH: 2.64 u[IU]/mL (ref 0.35–5.50)

## 2022-06-08 LAB — PSA: PSA: 0.32 ng/mL (ref 0.10–4.00)

## 2022-06-08 MED ORDER — ASPIRIN 325 MG PO TBEC: 325.0000 mg | DELAYED_RELEASE_TABLET | Freq: Every day | ORAL | 0 refills | Status: AC

## 2022-06-08 NOTE — Progress Notes (Signed)
Subjective:    Patient ID: Dylan Terry, male    DOB: 1960/12/08, 61 y.o.   MRN: [id number]  HPI Here for a well exam. He feels well. We saw him a few weeks ago for a Strep infection, and this responded well to the antibiotic.    Review of Systems  Constitutional: Negative.   HENT: Negative.    Eyes: Negative.   Respiratory: Negative.    Cardiovascular: Negative.   Gastrointestinal: Negative.   Genitourinary: Negative.   Musculoskeletal: Negative.   Skin: Negative.   Neurological: Negative.   Psychiatric/Behavioral: Negative.         Objective:   Physical Exam Constitutional:      General: He is not in acute distress.    Appearance: Normal appearance. He is well-developed. He is not diaphoretic.  HENT:     Head: Normocephalic and atraumatic.     Right Ear: External ear normal.     Left Ear: External ear normal.     Nose: Nose normal.     Mouth/Throat:     Pharynx: No oropharyngeal exudate.  Eyes:     General: No scleral icterus.       Right eye: No discharge.        Left eye: No discharge.     Conjunctiva/sclera: Conjunctivae normal.     Pupils: Pupils are equal, round, and reactive to light.  Neck:     Thyroid: No thyromegaly.     Vascular: No JVD.     Trachea: No tracheal deviation.  Cardiovascular:     Rate and Rhythm: Normal rate and regular rhythm.     Heart sounds: Normal heart sounds. No murmur heard.    No friction rub. No gallop.  Pulmonary:     Effort: Pulmonary effort is normal. No respiratory distress.     Breath sounds: Normal breath sounds. No wheezing or rales.  Chest:     Chest wall: No tenderness.  Abdominal:     General: Bowel sounds are normal. There is no distension.     Palpations: Abdomen is soft. There is no mass.     Tenderness: There is no abdominal tenderness. There is no guarding or rebound.  Genitourinary:    Penis: Normal. No tenderness.      Testes: Normal.     Prostate: Normal.     Rectum: Normal. Guaiac result negative.   Musculoskeletal:        General: No tenderness. Normal range of motion.     Cervical back: Neck supple.  Lymphadenopathy:     Cervical: No cervical adenopathy.  Skin:    General: Skin is warm and dry.     Coloration: Skin is not pale.     Findings: No erythema or rash.  Neurological:     Mental Status: He is alert and oriented to person, place, and time.     Cranial Nerves: No cranial nerve deficit.     Motor: No abnormal muscle tone.     Coordination: Coordination normal.     Deep Tendon Reflexes: Reflexes are normal and symmetric. Reflexes normal.  Psychiatric:        Behavior: Behavior normal.        Thought Content: Thought content normal.        Judgment: Judgment normal.           Assessment & Plan:  Well exam. We discussed diet and exercise. Get fasting labs. For the protein C deficiency we will increase his daily ASA from 81  mg to 325 mg.  Alysia Penna, MD

## 2023-07-06 ENCOUNTER — Other Ambulatory Visit: Payer: Self-pay | Admitting: Family Medicine

## 2024-03-07 ENCOUNTER — Other Ambulatory Visit: Payer: Self-pay | Admitting: Internal Medicine

## 2024-03-08 MED ORDER — FLUTICASONE PROPIONATE 50 MCG/ACT NA SUSP: 2.0000 | Freq: Every morning | NASAL | 11 refills | Status: AC

## 2024-06-18 ENCOUNTER — Encounter: Payer: Self-pay | Admitting: Family Medicine

## 2024-06-18 ENCOUNTER — Ambulatory Visit (INDEPENDENT_AMBULATORY_CARE_PROVIDER_SITE_OTHER): Admitting: Family Medicine

## 2024-06-18 VITALS — BP 120/78 | HR 50 | Temp 98.7°F | Ht 73.0 in | Wt 180.0 lb

## 2024-06-18 DIAGNOSIS — R059 Cough, unspecified: Secondary | ICD-10-CM

## 2024-06-18 DIAGNOSIS — J019 Acute sinusitis, unspecified: Secondary | ICD-10-CM | POA: Diagnosis not present

## 2024-06-18 LAB — POC COVID19 BINAXNOW: SARS Coronavirus 2 Ag: NEGATIVE

## 2024-06-18 MED ORDER — AMOXICILLIN-POT CLAVULANATE 875-125 MG PO TABS: 1.0000 | ORAL_TABLET | Freq: Two times a day (BID) | ORAL | 0 refills | Status: AC

## 2024-06-18 NOTE — Addendum Note (Signed)
 Addended by: LADONNA INOCENTE SAILOR on: 06/18/2024 04:37 PM   Modules accepted: Orders

## 2024-06-18 NOTE — Progress Notes (Signed)
   Subjective:    Patient ID: Norleen Gaster, male    DOB: January 20, 1961, 63 y.o.   MRN: [id number]  HPI Here for 4 weeks of intermittent sinus congestion, PND, and a dry cough. No fever or SOB. Using Flonase  and saline nasal sprays.    Review of Systems  Constitutional: Negative.   HENT:  Positive for congestion, postnasal drip and sinus pressure. Negative for ear pain and sore throat.   Eyes: Negative.   Respiratory:  Positive for cough. Negative for shortness of breath and wheezing.        Objective:   Physical Exam Constitutional:      Appearance: Normal appearance.  HENT:     Right Ear: Tympanic membrane, ear canal and external ear normal.     Left Ear: Tympanic membrane, ear canal and external ear normal.     Nose: Nose normal.     Mouth/Throat:     Pharynx: Oropharynx is clear.  Eyes:     Conjunctiva/sclera: Conjunctivae normal.  Pulmonary:     Effort: Pulmonary effort is normal.     Breath sounds: Normal breath sounds.  Lymphadenopathy:     Cervical: No cervical adenopathy.  Neurological:     Mental Status: He is alert.           Assessment & Plan:  Sinusitis, treat with 10 days of Augmentin .  Garnette Olmsted, MD

## 2024-12-22 ENCOUNTER — Other Ambulatory Visit: Payer: Self-pay | Admitting: Internal Medicine
